# Patient Record
Sex: Male | Born: 1970 | Race: White | Hispanic: No | Marital: Single | Smoking: Former smoker
Health system: Southern US, Community
[De-identification: ages and names within clinical notes are randomized; demographics above are authoritative.]

## PROBLEM LIST (undated history)

## (undated) DIAGNOSIS — J45909 Unspecified asthma, uncomplicated: Secondary | ICD-10-CM

## (undated) DIAGNOSIS — E119 Type 2 diabetes mellitus without complications: Secondary | ICD-10-CM

## (undated) DIAGNOSIS — T7840XA Allergy, unspecified, initial encounter: Secondary | ICD-10-CM

## (undated) DIAGNOSIS — G4730 Sleep apnea, unspecified: Secondary | ICD-10-CM

## (undated) DIAGNOSIS — I10 Essential (primary) hypertension: Secondary | ICD-10-CM

## (undated) DIAGNOSIS — E785 Hyperlipidemia, unspecified: Secondary | ICD-10-CM

## (undated) HISTORY — DX: Essential (primary) hypertension: I10

## (undated) HISTORY — DX: Unspecified asthma, uncomplicated: J45.909

## (undated) HISTORY — DX: Type 2 diabetes mellitus without complications: E11.9

## (undated) HISTORY — DX: Allergy, unspecified, initial encounter: T78.40XA

## (undated) HISTORY — DX: Hyperlipidemia, unspecified: E78.5

## (undated) HISTORY — DX: Sleep apnea, unspecified: G47.30

---
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
## 1997-12-19 ENCOUNTER — Emergency Department (HOSPITAL_COMMUNITY): Admission: EM | Admit: 1997-12-19 | Discharge: 1997-12-19 | Payer: Self-pay | Admitting: Emergency Medicine

## 1999-11-24 ENCOUNTER — Encounter: Admission: RE | Admit: 1999-11-24 | Discharge: 1999-11-24 | Payer: Self-pay | Admitting: Family Medicine

## 1999-11-24 ENCOUNTER — Encounter: Payer: Self-pay | Admitting: Family Medicine

## 2002-05-31 HISTORY — PX: GANGLION CYST EXCISION: SHX1691

## 2005-01-05 ENCOUNTER — Ambulatory Visit: Payer: Self-pay | Admitting: Family Medicine

## 2005-02-22 ENCOUNTER — Ambulatory Visit: Payer: Self-pay | Admitting: Family Medicine

## 2005-03-01 ENCOUNTER — Ambulatory Visit: Payer: Self-pay | Admitting: Family Medicine

## 2005-03-15 ENCOUNTER — Ambulatory Visit: Payer: Self-pay | Admitting: Family Medicine

## 2005-04-16 ENCOUNTER — Ambulatory Visit: Payer: Self-pay | Admitting: Family Medicine

## 2005-10-06 ENCOUNTER — Ambulatory Visit: Payer: Self-pay | Admitting: Family Medicine

## 2005-10-08 ENCOUNTER — Ambulatory Visit: Payer: Self-pay | Admitting: Family Medicine

## 2005-10-14 ENCOUNTER — Ambulatory Visit: Payer: Self-pay | Admitting: Family Medicine

## 2006-08-02 ENCOUNTER — Ambulatory Visit: Payer: Self-pay | Admitting: Family Medicine

## 2006-08-02 LAB — CONVERTED CEMR LAB
ALT: 41 units/L — ABNORMAL HIGH (ref 0–40)
AST: 33 units/L (ref 0–37)
Albumin: 4.1 g/dL (ref 3.5–5.2)
Alkaline Phosphatase: 71 units/L (ref 39–117)
BUN: 13 mg/dL (ref 6–23)
Basophils Absolute: 0 10*3/uL (ref 0.0–0.1)
Basophils Relative: 0.8 % (ref 0.0–1.0)
Bilirubin, Direct: 0.1 mg/dL (ref 0.0–0.3)
CO2: 29 meq/L (ref 19–32)
Calcium: 9.3 mg/dL (ref 8.4–10.5)
Chloride: 106 meq/L (ref 96–112)
Cholesterol: 173 mg/dL (ref 0–200)
Creatinine, Ser: 1 mg/dL (ref 0.4–1.5)
Direct LDL: 46.6 mg/dL
Eosinophils Absolute: 0.1 10*3/uL (ref 0.0–0.6)
Eosinophils Relative: 1.8 % (ref 0.0–5.0)
GFR calc Af Amer: 110 mL/min
GFR calc non Af Amer: 91 mL/min
Glucose, Bld: 95 mg/dL (ref 70–99)
HCT: 42.7 % (ref 39.0–52.0)
HDL: 49.7 mg/dL (ref >39.0)
Hemoglobin: 14.6 g/dL (ref 13.0–17.0)
Lymphocytes Relative: 37.8 % (ref 12.0–46.0)
MCHC: 34.3 g/dL (ref 30.0–36.0)
MCV: 91 fL (ref 78.0–100.0)
Monocytes Absolute: 0.5 10*3/uL (ref 0.2–0.7)
Monocytes Relative: 13.3 % — ABNORMAL HIGH (ref 3.0–11.0)
Neutro Abs: 2 10*3/uL (ref 1.4–7.7)
Neutrophils Relative %: 46.3 % (ref 43.0–77.0)
Platelets: 230 10*3/uL (ref 150–400)
Potassium: 4.4 meq/L (ref 3.5–5.1)
RBC: 4.7 M/uL (ref 4.22–5.81)
RDW: 12.4 % (ref 11.5–14.6)
Sodium: 142 meq/L (ref 135–145)
TSH: 3.05 microintl units/mL (ref 0.35–5.50)
Total Bilirubin: 0.9 mg/dL (ref 0.3–1.2)
Total CHOL/HDL Ratio: 3.5
Total Protein: 7.1 g/dL (ref 6.0–8.3)
Triglycerides: 317 mg/dL (ref 0–149)
VLDL: 63 mg/dL — ABNORMAL HIGH (ref 0–40)
WBC: 4.1 10*3/uL — ABNORMAL LOW (ref 4.5–10.5)

## 2006-08-09 ENCOUNTER — Ambulatory Visit: Payer: Self-pay | Admitting: Family Medicine

## 2007-12-07 ENCOUNTER — Telehealth: Payer: Self-pay | Admitting: *Deleted

## 2008-03-21 ENCOUNTER — Telehealth: Payer: Self-pay | Admitting: Family Medicine

## 2008-03-22 ENCOUNTER — Telehealth: Payer: Self-pay | Admitting: Family Medicine

## 2008-04-20 DIAGNOSIS — J069 Acute upper respiratory infection, unspecified: Secondary | ICD-10-CM | POA: Insufficient documentation

## 2008-04-22 ENCOUNTER — Ambulatory Visit: Payer: Self-pay | Admitting: Family Medicine

## 2008-04-22 DIAGNOSIS — E785 Hyperlipidemia, unspecified: Secondary | ICD-10-CM | POA: Insufficient documentation

## 2008-04-22 DIAGNOSIS — J309 Allergic rhinitis, unspecified: Secondary | ICD-10-CM | POA: Insufficient documentation

## 2008-04-22 DIAGNOSIS — Z87442 Personal history of urinary calculi: Secondary | ICD-10-CM | POA: Insufficient documentation

## 2008-04-22 LAB — CONVERTED CEMR LAB
ALT: 48 units/L (ref 0–53)
AST: 31 units/L (ref 0–37)
Albumin: 4.1 g/dL (ref 3.5–5.2)
Alkaline Phosphatase: 77 units/L (ref 39–117)
BUN: 8 mg/dL (ref 6–23)
Basophils Absolute: 0 10*3/uL (ref 0.0–0.1)
Basophils Relative: 0.6 % (ref 0.0–3.0)
Bilirubin Urine: NEGATIVE
Bilirubin, Direct: 0.1 mg/dL (ref 0.0–0.3)
Blood in Urine, dipstick: NEGATIVE
CO2: 28 meq/L (ref 19–32)
Calcium: 9.6 mg/dL (ref 8.4–10.5)
Chloride: 106 meq/L (ref 96–112)
Cholesterol: 120 mg/dL (ref 0–200)
Creatinine, Ser: 1 mg/dL (ref 0.4–1.5)
Eosinophils Absolute: 0.1 10*3/uL (ref 0.0–0.7)
Eosinophils Relative: 1 % (ref 0.0–5.0)
GFR calc Af Amer: 109 mL/min
GFR calc non Af Amer: 90 mL/min
Glucose, Bld: 109 mg/dL — ABNORMAL HIGH (ref 70–99)
Glucose, Urine, Semiquant: NEGATIVE
HCT: 41.9 % (ref 39.0–52.0)
HDL: 51.6 mg/dL (ref >39.0)
Hemoglobin: 14.6 g/dL (ref 13.0–17.0)
Ketones, urine, test strip: NEGATIVE
LDL Cholesterol: 41 mg/dL (ref 0–99)
Lymphocytes Relative: 20.8 % (ref 12.0–46.0)
MCHC: 34.9 g/dL (ref 30.0–36.0)
MCV: 89.4 fL (ref 78.0–100.0)
Monocytes Absolute: 1 10*3/uL (ref 0.1–1.0)
Monocytes Relative: 15.4 % — ABNORMAL HIGH (ref 3.0–12.0)
Neutro Abs: 4 10*3/uL (ref 1.4–7.7)
Neutrophils Relative %: 62.2 % (ref 43.0–77.0)
Nitrite: NEGATIVE
Platelets: 216 10*3/uL (ref 150–400)
Potassium: 4.3 meq/L (ref 3.5–5.1)
Protein, U semiquant: NEGATIVE
RBC: 4.68 M/uL (ref 4.22–5.81)
RDW: 12.4 % (ref 11.5–14.6)
Sodium: 141 meq/L (ref 135–145)
Specific Gravity, Urine: 1.025
TSH: 2.94 microintl units/mL (ref 0.35–5.50)
Total Bilirubin: 0.8 mg/dL (ref 0.3–1.2)
Total CHOL/HDL Ratio: 2.3
Total Protein: 8.1 g/dL (ref 6.0–8.3)
Triglycerides: 135 mg/dL (ref 0–149)
Urobilinogen, UA: 0.2
VLDL: 27 mg/dL (ref 0–40)
WBC Urine, dipstick: NEGATIVE
WBC: 6.4 10*3/uL (ref 4.5–10.5)
pH: 5

## 2009-02-18 ENCOUNTER — Ambulatory Visit: Payer: Self-pay | Admitting: Family Medicine

## 2009-02-18 DIAGNOSIS — J45909 Unspecified asthma, uncomplicated: Secondary | ICD-10-CM | POA: Insufficient documentation

## 2009-03-11 ENCOUNTER — Ambulatory Visit: Payer: Self-pay | Admitting: Family Medicine

## 2009-03-11 DIAGNOSIS — I10 Essential (primary) hypertension: Secondary | ICD-10-CM | POA: Insufficient documentation

## 2009-03-20 ENCOUNTER — Ambulatory Visit: Payer: Self-pay | Admitting: Family Medicine

## 2009-03-24 LAB — CONVERTED CEMR LAB
BUN: 12 mg/dL (ref 6–23)
CO2: 29 meq/L (ref 19–32)
Calcium: 9.7 mg/dL (ref 8.4–10.5)
Chloride: 104 meq/L (ref 96–112)
Creatinine, Ser: 0.9 mg/dL (ref 0.4–1.5)
GFR calc non Af Amer: 100.71 mL/min (ref >60)
Glucose, Bld: 96 mg/dL (ref 70–99)
Potassium: 4.4 meq/L (ref 3.5–5.1)
Sodium: 140 meq/L (ref 135–145)

## 2009-05-22 ENCOUNTER — Ambulatory Visit: Payer: Self-pay | Admitting: Family Medicine

## 2009-06-04 ENCOUNTER — Telehealth: Payer: Self-pay | Admitting: Family Medicine

## 2010-06-08 ENCOUNTER — Ambulatory Visit
Admission: RE | Admit: 2010-06-08 | Discharge: 2010-06-08 | Payer: Self-pay | Source: Home / Self Care | Attending: Family Medicine | Admitting: Family Medicine

## 2010-06-08 ENCOUNTER — Other Ambulatory Visit: Payer: Self-pay | Admitting: Family Medicine

## 2010-06-08 ENCOUNTER — Encounter: Payer: Self-pay | Admitting: Family Medicine

## 2010-06-08 LAB — CBC WITH DIFFERENTIAL/PLATELET
Basophils Absolute: 0 10*3/uL (ref 0.0–0.1)
Basophils Relative: 0.4 % (ref 0.0–3.0)
Eosinophils Absolute: 0 10*3/uL (ref 0.0–0.7)
Eosinophils Relative: 0.8 % (ref 0.0–5.0)
HCT: 43.5 % (ref 39.0–52.0)
Hemoglobin: 14.8 g/dL (ref 13.0–17.0)
Lymphocytes Relative: 23.2 % (ref 12.0–46.0)
Lymphs Abs: 1 10*3/uL (ref 0.7–4.0)
MCHC: 34.1 g/dL (ref 30.0–36.0)
MCV: 93.3 fl (ref 78.0–100.0)
Monocytes Absolute: 0.6 10*3/uL (ref 0.1–1.0)
Monocytes Relative: 14.5 % — ABNORMAL HIGH (ref 3.0–12.0)
Neutro Abs: 2.5 10*3/uL (ref 1.4–7.7)
Neutrophils Relative %: 61.1 % (ref 43.0–77.0)
Platelets: 192 10*3/uL (ref 150.0–400.0)
RBC: 4.66 Mil/uL (ref 4.22–5.81)
RDW: 14.2 % (ref 11.5–14.6)
WBC: 4.1 10*3/uL — ABNORMAL LOW (ref 4.5–10.5)

## 2010-06-08 LAB — BASIC METABOLIC PANEL
BUN: 15 mg/dL (ref 6–23)
CO2: 26 mEq/L (ref 19–32)
Calcium: 10.3 mg/dL (ref 8.4–10.5)
Chloride: 101 mEq/L (ref 96–112)
Creatinine, Ser: 0.9 mg/dL (ref 0.4–1.5)
GFR: 96.34 mL/min (ref >60.00)
Glucose, Bld: 91 mg/dL (ref 70–99)
Potassium: 4.8 mEq/L (ref 3.5–5.1)
Sodium: 138 mEq/L (ref 135–145)

## 2010-06-08 LAB — CONVERTED CEMR LAB
Blood in Urine, dipstick: NEGATIVE
Glucose, Urine, Semiquant: NEGATIVE
Nitrite: NEGATIVE
Protein, U semiquant: NEGATIVE
Specific Gravity, Urine: 1.02
Urobilinogen, UA: 0.2
WBC Urine, dipstick: NEGATIVE
pH: 5

## 2010-06-08 LAB — HEPATIC FUNCTION PANEL
ALT: 51 U/L (ref 0–53)
AST: 37 U/L (ref 0–37)
Albumin: 4.7 g/dL (ref 3.5–5.2)
Alkaline Phosphatase: 60 U/L (ref 39–117)
Bilirubin, Direct: 0.1 mg/dL (ref 0.0–0.3)
Total Bilirubin: 0.9 mg/dL (ref 0.3–1.2)
Total Protein: 7.9 g/dL (ref 6.0–8.3)

## 2010-06-08 LAB — LIPID PANEL
Cholesterol: 230 mg/dL — ABNORMAL HIGH (ref 0–200)
HDL: 53.1 mg/dL (ref >39.00)
Total CHOL/HDL Ratio: 4
Triglycerides: 275 mg/dL — ABNORMAL HIGH (ref 0.0–149.0)
VLDL: 55 mg/dL — ABNORMAL HIGH (ref 0.0–40.0)

## 2010-06-08 LAB — LDL CHOLESTEROL, DIRECT: Direct LDL: 82.3 mg/dL

## 2010-06-08 LAB — TSH: TSH: 2.48 u[IU]/mL (ref 0.35–5.50)

## 2010-06-28 LAB — CONVERTED CEMR LAB
ALT: 51 units/L (ref 0–53)
AST: 36 units/L (ref 0–37)
Albumin: 4.5 g/dL (ref 3.5–5.2)
Alkaline Phosphatase: 69 units/L (ref 39–117)
BUN: 10 mg/dL (ref 6–23)
Basophils Absolute: 0 10*3/uL (ref 0.0–0.1)
Basophils Relative: 0.2 % (ref 0.0–3.0)
Bilirubin Urine: NEGATIVE
Bilirubin, Direct: 0 mg/dL (ref 0.0–0.3)
Blood in Urine, dipstick: NEGATIVE
CO2: 26 meq/L (ref 19–32)
Calcium: 9.6 mg/dL (ref 8.4–10.5)
Chloride: 103 meq/L (ref 96–112)
Cholesterol: 179 mg/dL (ref 0–200)
Creatinine, Ser: 0.9 mg/dL (ref 0.4–1.5)
Direct LDL: 54.6 mg/dL
Eosinophils Absolute: 0 10*3/uL (ref 0.0–0.7)
Eosinophils Relative: 0.7 % (ref 0.0–5.0)
GFR calc non Af Amer: 100.62 mL/min (ref >60)
Glucose, Bld: 103 mg/dL — ABNORMAL HIGH (ref 70–99)
Glucose, Urine, Semiquant: NEGATIVE
HCT: 42.9 % (ref 39.0–52.0)
HDL: 56.2 mg/dL (ref >39.00)
Hemoglobin: 14.3 g/dL (ref 13.0–17.0)
Ketones, urine, test strip: NEGATIVE
Lymphocytes Relative: 18.3 % (ref 12.0–46.0)
Lymphs Abs: 1.3 10*3/uL (ref 0.7–4.0)
MCHC: 33.3 g/dL (ref 30.0–36.0)
MCV: 93.6 fL (ref 78.0–100.0)
Monocytes Absolute: 0.9 10*3/uL (ref 0.1–1.0)
Monocytes Relative: 13 % — ABNORMAL HIGH (ref 3.0–12.0)
Neutro Abs: 4.7 10*3/uL (ref 1.4–7.7)
Neutrophils Relative %: 67.8 % (ref 43.0–77.0)
Nitrite: NEGATIVE
Platelets: 199 10*3/uL (ref 150.0–400.0)
Potassium: 4.4 meq/L (ref 3.5–5.1)
Protein, U semiquant: NEGATIVE
RBC: 4.58 M/uL (ref 4.22–5.81)
RDW: 13.4 % (ref 11.5–14.6)
Sodium: 137 meq/L (ref 135–145)
Specific Gravity, Urine: <1.005
TSH: 2.03 microintl units/mL (ref 0.35–5.50)
Total Bilirubin: 1 mg/dL (ref 0.3–1.2)
Total CHOL/HDL Ratio: 3
Total Protein: 8.1 g/dL (ref 6.0–8.3)
Triglycerides: 371 mg/dL — ABNORMAL HIGH (ref 0.0–149.0)
Urobilinogen, UA: 0.2
VLDL: 74.2 mg/dL — ABNORMAL HIGH (ref 0.0–40.0)
WBC Urine, dipstick: NEGATIVE
WBC: 6.9 10*3/uL (ref 4.5–10.5)
pH: 5.5

## 2010-06-30 NOTE — Assessment & Plan Note (Signed)
Summary: 3 wk rov/mm   Vital Signs:  Patient profile:   40 year old male Weight:      239 pounds Temp:     98.5 degrees F oral BP sitting:   142 / 82  Vitals Entered By: Lynann Beaver CMA (March 11, 2009 10:00 AM) CC: rov Is Patient Diabetic? No   CC:  rov.  History of Present Illness: Edwin Nelson is a 40 year old male, who comes back today for evaluation of elevated blood pressure.  He has underlying hyperlipidemia, which he inherited from his father.  He is on Crestor 20 mg daily.  Also, recently his blood pressure became elevated.  He's been monitoring his pressure at home and are all elevated.  The, highest it's been 165/90.  His mother has hypertension and is on atenolol.  He also takes Qvar 1 cup b.i.d. for asthma with Ventolin p.r.n. for exercise-induced asthma.  Current Medications (verified): 1)  Crestor 20 Mg Tabs (Rosuvastatin Calcium) .... Take One Tab Once Daily 2)  Flonase 50 Mcg/act Susp (Fluticasone Propionate) .... One Shot R&l Nose Qhs 3)  Qvar 40 Mcg/act Aers (Beclomethasone Dipropionate) .Marland Kitchen.. 1 Puff Two Times A Day 4)  Ventolin Hfa 108 (90 Base) Mcg/act Aers (Albuterol Sulfate) .... 2 Puffs 30 Min Prior To Exercise  Allergies (verified): No Known Drug Allergies  Past History:  Past Medical History: Hyperlipidemia Allergic rhinitis Nephrolithiasis, hx of Hypertension  Social History: Reviewed history from 04/22/2008 and no changes required. Occupation: Married Former Smoker Alcohol use-no Drug use-no Regular exercise-no  Review of Systems      See HPI  Physical Exam  General:  Well-developed,well-nourished,in no acute distress; alert,appropriate and cooperative throughout examination Heart:  142/82   Impression & Recommendations:  Problem # 1:  HYPERTENSION (ICD-401.9) Assessment New  Orders: Prescription Created Electronically [phone])  His updated medication list for this problem includes:    Zestril 10 Mg Tabs (Lisinopril) .Marland Kitchen... Take  1 tablet by mouth every morning  Complete Medication List: 1)  Crestor 20 Mg Tabs (Rosuvastatin calcium) .... Take one tab once daily 2)  Flonase 50 Mcg/act Susp (Fluticasone propionate) .... One shot r&l nose qhs 3)  Qvar 40 Mcg/act Aers (Beclomethasone dipropionate) .Marland Kitchen.. 1 puff two times a day 4)  Ventolin Hfa 108 (90 Base) Mcg/act Aers (Albuterol sulfate) .... 2 puffs 30 min prior to exercise 5)  Zestril 10 Mg Tabs (Lisinopril) .... Take 1 tablet by mouth every morning 6)  Proventil Hfa 108 (90 Base) Mcg/act Aers (Albuterol sulfate)  Patient Instructions: 1)  begin lisinopril 10 mg q.a.m. to check a morning blood pressure daily.  Return in one week for follow-up while taking this class of medication avoid all NSAIDs Prescriptions: VENTOLIN HFA 108 (90 BASE) MCG/ACT AERS (ALBUTEROL SULFATE) 2 puffs 30 min prior to exercise  #1 x 2   Entered and Authorized by:   Roderick Pee MD   Signed by:   Roderick Pee MD on 03/11/2009   Method used:   Print then Give to Patient   RxID:   [id number] ZESTRIL 10 MG TABS (LISINOPRIL) Take 1 tablet by mouth every morning  #100 x 3   Entered and Authorized by:   Roderick Pee MD   Signed by:   Roderick Pee MD on 03/11/2009   Method used:   Print then Give to Patient   RxID:   [id number]

## 2010-06-30 NOTE — Progress Notes (Signed)
Summary: crestor refill  Phone Note From Pharmacy   Caller: [facility] Details for Reason: crestor refill Summary of Call: pt would like a refill of his crestor  Follow-up for Phone Call        Pharmacist called Follow-up by: Kern Reap CMA,  December 07, 2007 4:43 PM      Prescriptions: CRESTOR 20 MG  TABS (ROSUVASTATIN CALCIUM) at bedtime  #30 x 0   Entered and Authorized by:   Kern Reap CMA   Signed by:   Kern Reap CMA on 12/07/2007   Method used:   Print then Give to Patient   RxID:   [id number]

## 2010-06-30 NOTE — Assessment & Plan Note (Signed)
Summary: cpx/[pt will be fasting/mm   Vital Signs:  Patient profile:   40 year old male Height:      68 inches Weight:      238 pounds Temp:     98.7 degrees F oral BP sitting:   120 / 80  (left arm) Cuff size:   regular  Vitals Entered By: Kern Reap CMA Duncan Dull) (May 22, 2009 11:01 AM)  Reason for Visit cpx  History of Present Illness: [name] is a 40 year old, married male, nonsmoker, who comes in today for evaluation of hypertension, hyperlipidemia, allergic rhinitis and asthma.  His hypertension is treated with Zestril 10 mg daily.  BP 120/80.  No side effects from medication.  His hyperlipidemia is treated with Crestor 20 mg nightly would check lipid panel today.  We tried other generic products like simvastatin Primacor, but either they don't work or they gave him side effects.  Therefore, he must stay on the Crestor.  He takes Qvar 41 puff b.i.d. for asthma.  He is having some flares would like to discuss increasing the dose.  Uses Proventil only p.r.n. for rescue.  He also uses over-the-counter Claritin and Flonase nasal spray for allergic rhinitis.  He also takes an 81-mg baby aspirin daily.  Tetanus booster 2004, seasonal flu 2010  Allergies (verified): No Known Drug Allergies  Past History:  Past medical, surgical, family and social histories (including risk factors) reviewed, and no changes noted (except as noted below).  Past Medical History: Reviewed history from 03/11/2009 and no changes required. Hyperlipidemia Allergic rhinitis Nephrolithiasis, hx of Hypertension  Past Surgical History: Reviewed history from 04/22/2008 and no changes required. Tonsillectomy  Family History: Reviewed history from 04/22/2008 and no changes required. father had an MI at age 49 with underlying obesity, diabetes, hyperlipidemia, and smoking.  Mother in good health except for high blood pressure.  A younger brother, Jonny Ruiz has hyperlipidemia  Social  History: Reviewed history from 04/22/2008 and no changes required. Occupation: Married Former Smoker Alcohol use-no Drug use-no Regular exercise-no  Review of Systems      See HPI  Physical Exam  General:  Well-developed,well-nourished,in no acute distress; alert,appropriate and cooperative throughout examination Head:  Normocephalic and atraumatic without obvious abnormalities. No apparent alopecia or balding. Eyes:  No corneal or conjunctival inflammation noted. EOMI. Perrla. Funduscopic exam benign, without hemorrhages, exudates or papilledema. Vision grossly normal. Ears:  External ear exam shows no significant lesions or deformities.  Otoscopic examination reveals clear canals, tympanic membranes are intact bilaterally without bulging, retraction, inflammation or discharge. Hearing is grossly normal bilaterally. Nose:  External nasal examination shows no deformity or inflammation. Nasal mucosa are pink and moist without lesions or exudates. Mouth:  Oral mucosa and oropharynx without lesions or exudates.  Teeth in good repair. Neck:  No deformities, masses, or tenderness noted. Chest Wall:  No deformities, masses, tenderness or gynecomastia noted. Breasts:  No masses or gynecomastia noted Lungs:  Normal respiratory effort, chest expands symmetrically. Lungs are clear to auscultation, no crackles or wheezes. Heart:  Normal rate and regular rhythm. S1 and S2 normal without gallop, murmur, click, rub or other extra sounds. Abdomen:  Bowel sounds positive,abdomen soft and non-tender without masses, organomegaly or hernias noted. Rectal:  No external abnormalities noted. Normal sphincter tone. No rectal masses or tenderness. Genitalia:  Testes bilaterally descended without nodularity, tenderness or masses. No scrotal masses or lesions. No penis lesions or urethral discharge. Prostate:  Prostate gland firm and smooth, no enlargement, nodularity, tenderness, mass, asymmetry or  induration. Msk:  No deformity or scoliosis noted of thoracic or lumbar spine.   Pulses:  R and L carotid,radial,femoral,dorsalis pedis and posterior tibial pulses are full and equal bilaterally Extremities:  No clubbing, cyanosis, edema, or deformity noted with normal full range of motion of all joints.   Neurologic:  No cranial nerve deficits noted. Station and gait are normal. Plantar reflexes are down-going bilaterally. DTRs are symmetrical throughout. Sensory, motor and coordinative functions appear intact. Skin:  Intact without suspicious lesions or rashes Cervical Nodes:  No lymphadenopathy noted Axillary Nodes:  No palpable lymphadenopathy Inguinal Nodes:  No significant adenopathy Psych:  Cognition and judgment appear intact. Alert and cooperative with normal attention span and concentration. No apparent delusions, illusions, hallucinations   Impression & Recommendations:  Problem # 1:  HYPERTENSION (ICD-401.9) Assessment Improved  His updated medication list for this problem includes:    Zestril 10 Mg Tabs (Lisinopril) .Marland Kitchen... Take 1 tablet by mouth every morning  Orders: Venipuncture (81191) Prescription Created Electronically [phone]) UA Dipstick w/o Micro (automated)  (81003) TLB-Lipid Panel (80061-LIPID) TLB-BMP (Basic Metabolic Panel-BMET) (80048-METABOL) TLB-CBC Platelet - w/Differential (85025-CBCD) TLB-Hepatic/Liver Function Pnl (80076-HEPATIC) TLB-TSH (Thyroid Stimulating Hormone) (84443-TSH)  Problem # 2:  EXTRINSIC ASTHMA, UNSPECIFIED (ICD-493.00) Assessment: Improved  The following medications were removed from the medication list:    Qvar 40 Mcg/act Aers (Beclomethasone dipropionate) .Marland Kitchen... 1 puff two times a day    Ventolin Hfa 108 (90 Base) Mcg/act Aers (Albuterol sulfate) .Marland Kitchen... 2 puffs 30 min prior to exercise His updated medication list for this problem includes:    Proventil Hfa 108 (90 Base) Mcg/act Aers (Albuterol sulfate) .Marland Kitchen... 2 puff three times a day  as needed    Qvar 80 Mcg/act Aers (Beclomethasone dipropionate) .Marland Kitchen... 1 puff two times a day  Orders: Venipuncture (56213) Prescription Created Electronically [phone]) UA Dipstick w/o Micro (automated)  (81003) TLB-Lipid Panel (80061-LIPID) TLB-BMP (Basic Metabolic Panel-BMET) (80048-METABOL) TLB-CBC Platelet - w/Differential (85025-CBCD) TLB-Hepatic/Liver Function Pnl (80076-HEPATIC) TLB-TSH (Thyroid Stimulating Hormone) (84443-TSH)  Problem # 3:  ALLERGIC RHINITIS (ICD-477.9) Assessment: Improved  His updated medication list for this problem includes:    Flonase 50 Mcg/act Susp (Fluticasone propionate) ..... One shot r&l nose qhs    Claritin 10 Mg Tabs (Loratadine) .Marland Kitchen... Take one tab once daily  Orders: Venipuncture (84696) Prescription Created Electronically [phone] UA Dipstick w/o Micro (automated)  (81003) TLB-Lipid Panel (80061-LIPID) TLB-BMP (Basic Metabolic Panel-BMET) (80048-METABOL) TLB-CBC Platelet - w/Differential (85025-CBCD) TLB-Hepatic/Liver Function Pnl (80076-HEPATIC) TLB-TSH (Thyroid Stimulating Hormone) (84443-TSH)  Problem # 4:  HYPERLIPIDEMIA (ICD-272.4) Assessment: Improved  His updated medication list for this problem includes:    Crestor 20 Mg Tabs (Rosuvastatin calcium) .Marland Kitchen... Take one tab once daily  Orders: Venipuncture (41324) Prescription Created Electronically [phone]) UA Dipstick w/o Micro (automated)  (81003) EKG w/ Interpretation (93000) TLB-Lipid Panel (80061-LIPID) TLB-BMP (Basic Metabolic Panel-BMET) (80048-METABOL) TLB-CBC Platelet - w/Differential (85025-CBCD) TLB-Hepatic/Liver Function Pnl (80076-HEPATIC) TLB-TSH (Thyroid Stimulating Hormone) (84443-TSH)  Complete Medication List: 1)  Crestor 20 Mg Tabs (Rosuvastatin calcium) .... Take one tab once daily 2)  Flonase 50 Mcg/act Susp (Fluticasone propionate) .... One shot r&l nose qhs 3)  Zestril 10 Mg Tabs (Lisinopril) .... Take 1 tablet by mouth every morning 4)  Proventil Hfa 108  (90 Base) Mcg/act Aers (Albuterol sulfate) .... 2 puff three times a day as needed 5)  Aspirin 81 Mg Tbec (Aspirin) .... Once daily 6)  Daily Vitamins Tabs (Multiple vitamin) .... Once daily 7)  Claritin 10 Mg Tabs (Loratadine) .... Take one tab  once daily 8)  Qvar 80 Mcg/act Aers (Beclomethasone dipropionate) .Marland Kitchen.. 1 puff two times a day  Patient Instructions: 1)  Please schedule a follow-up appointment in 1 year. 2)  It is important that you exercise regularly at least 20 minutes 5 times a week. If you develop chest pain, have severe difficulty breathing, or feel very tired , stop exercising immediately and seek medical attention. 3)  You need to lose weight. Consider a lower calorie diet and regular exercise.  Prescriptions: PROVENTIL HFA 108 (90 BASE) MCG/ACT AERS (ALBUTEROL SULFATE) 2 puff three times a day as needed  #1 x 1   Entered and Authorized by:   Roderick Pee MD   Signed by:   Roderick Pee MD on 05/22/2009   Method used:   Electronically to        [facility]* (mail-order)             ,          Ph: [phone]       Fax: [phone]   RxID:   [id number] ZESTRIL 10 MG TABS (LISINOPRIL) Take 1 tablet by mouth every morning  #100 x 3   Entered and Authorized by:   Roderick Pee MD   Signed by:   Roderick Pee MD on 05/22/2009   Method used:   Electronically to        [facility]* (mail-order)             ,          Ph: [phone]       Fax: [phone]   RxID:   [id number] FLONASE 50 MCG/ACT SUSP (FLUTICASONE PROPIONATE) one shot R&L nose qhs  #3 units x 3   Entered and Authorized by:   Roderick Pee MD   Signed by:   Roderick Pee MD on 05/22/2009   Method used:   Electronically to        [facility]* (mail-order)             ,          Ph: [phone]       Fax: [phone]   RxID:   [id number] CRESTOR 20 MG TABS (ROSUVASTATIN CALCIUM) take one tab once daily  #100 x 3   Entered and Authorized by:   Roderick Pee MD   Signed  by:   Roderick Pee MD on 05/22/2009   Method used:   Electronically to        [facility]* (mail-order)             ,          Ph: [phone]       Fax: [phone]   RxID:   [id number] HBZJ 80 MCG/ACT AERS (BECLOMETHASONE DIPROPIONATE) 1 puff two times a day  #3 units x 3   Entered and Authorized by:   Roderick Pee MD   Signed by:   Roderick Pee MD on 05/22/2009   Method used:   Electronically to        [facility]* (mail-order)             ,          Ph: [phone]       Fax: [phone]   RxID:   [id number]    Immunization History:  Influenza Immunization History:    Influenza:  historical (02/28/2009)   Laboratory Results   Urine Tests  Routine Urinalysis   Color: yellow Appearance: Clear Glucose: negative   (Normal Range: Negative) Bilirubin: negative   (Normal Range: Negative) Ketone: negative   (Normal Range: Negative) Spec. Gravity: <1.005   (Normal Range: 1.003-1.035) Blood: negative   (Normal Range: Negative) pH: 5.5   (Normal Range: 5.0-8.0) Protein: negative   (Normal Range: Negative) Urobilinogen: 0.2   (Normal Range: 0-1) Nitrite: negative   (Normal Range: Negative) Leukocyte Esterace: negative   (Normal Range: Negative)    Comments: Joanne Chars CMA  May 22, 2009 3:07 PM

## 2010-06-30 NOTE — Progress Notes (Signed)
Summary: crestor denied  Phone Note From Pharmacy   Details for Reason: crestor refill Summary of Call: pharm request refill of crestor Initial call taken by: Kern Reap CMA,  March 21, 2008 2:03 PM  Follow-up for Phone Call        rx denied Follow-up by: Kern Reap CMA,  March 21, 2008 2:03 PM      Prescriptions: CRESTOR 20 MG  TABS (ROSUVASTATIN CALCIUM) at bedtime  #0 x 0   Entered and Authorized by:   Kern Reap CMA   Signed by:   Kern Reap CMA on 03/21/2008   Method used:   Print then Give to Patient   RxID:   [id number]

## 2010-06-30 NOTE — Assessment & Plan Note (Signed)
Summary: med check/mhf   Vital Signs:  Patient Profile:   40 Years Old Male Height:     68 inches Weight:      233 pounds Temp:     98.3 degrees F oral Pulse rate:   64 / minute Pulse rhythm:   regular BP sitting:   120 / 80  (left arm) Cuff size:   large  Vitals Entered By: Kern Reap CMA (April 22, 2008 9:27 AM)                 Chief Complaint:  follow up meds and leg.  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  His underlying hyperlipidemia is on Crestor 20 mg daily.  Check fasting lipid panel today.  His underlying allergic rhinitis is try Claritin 10 mg daily, but is not helping.  We discussed options, including taking Zyrtec, 10 mg two times a day, that time, and having a steroid nasal spray, and if all else fails, taking a short course of oral prednisone.  Saturday he developed a viral-like syndrome with sore throat, aching, fever, chills, cough, nonproductive.  Today, temperature is 98.3.  A year ago.  We started him on a smoking cessation program.  He quit smoking and has been off cigarettes for 4 year.  Last tetanus was two 2004 seasonal flu shot.  He had in October.    Current Allergies: No known allergies   Past Medical History:    Reviewed history and no changes required:       Hyperlipidemia       Allergic rhinitis       Nephrolithiasis, hx of  Past Surgical History:    Reviewed history and no changes required:       Tonsillectomy   Family History:    Reviewed history and no changes required:       father had an MI at age 23 with underlying obesity, diabetes, hyperlipidemia, and smoking.              Mother in good health except for high blood pressure.              A younger brother, Edwin Nelson has hyperlipidemia  Social  History:    Reviewed history and no changes required:       Occupation:       Married       Former Smoker       Alcohol use-no       Drug use-no       Regular exercise-no   Risk Factors:  Tobacco use:  quit    Year quit:  08 Drug use:  no Alcohol use:  no Exercise:  no   Review of Systems      See HPI   Physical Exam  General:     Well-developed,well-nourished,in no acute distress; alert,appropriate and cooperative throughout examination Head:     Normocephalic and atraumatic without obvious abnormalities. No apparent alopecia or balding. Eyes:     No corneal or conjunctival inflammation noted. EOMI. Perrla. Funduscopic exam benign, without hemorrhages, exudates or papilledema. Vision grossly normal. Ears:     External ear exam shows no significant lesions or deformities.  Otoscopic examination reveals clear canals, tympanic membranes are intact bilaterally without bulging, retraction, inflammation or discharge. Hearing is grossly normal  bilaterally. Nose:     External nasal examination shows no deformity or inflammation. Nasal mucosa are pink and moist without lesions or exudates. Mouth:     Oral mucosa and oropharynx without lesions or exudates.  Teeth in good repair. Neck:     No deformities, masses, or tenderness noted. Chest Wall:     No deformities, masses, tenderness or gynecomastia noted. Breasts:     No masses or gynecomastia noted Lungs:     Normal respiratory effort, chest expands symmetrically. Lungs are clear to auscultation, no crackles or wheezes. Heart:     Normal rate and regular rhythm. S1 and S2 normal without gallop, murmur, click, rub or other extra sounds. Abdomen:     Bowel sounds positive,abdomen soft and non-tender without masses, organomegaly or hernias noted. Rectal:     No external abnormalities noted. Normal sphincter tone. No rectal masses or tenderness. Genitalia:     Testes bilaterally descended without nodularity, tenderness or  masses. No scrotal masses or lesions. No penis lesions or urethral discharge. Prostate:     Prostate gland firm and smooth, no enlargement, nodularity, tenderness, mass, asymmetry or induration. Msk:     No deformity or scoliosis noted of thoracic or lumbar spine.   Pulses:     R and L carotid,radial,femoral,dorsalis pedis and posterior tibial pulses are full and equal bilaterally Extremities:     No clubbing, cyanosis, edema, or deformity noted with normal full range of motion of all joints.   Neurologic:     No cranial nerve deficits noted. Station and gait are normal. Plantar reflexes are down-going bilaterally. DTRs are symmetrical throughout. Sensory, motor and coordinative functions appear intact. Skin:     Intact without suspicious lesions or rashes Cervical Nodes:     No lymphadenopathy noted Axillary Nodes:     No palpable lymphadenopathy Inguinal Nodes:     No significant adenopathy Psych:     Cognition and judgment appear intact. Alert and cooperative with normal attention span and concentration. No apparent delusions, illusions, hallucinations    Impression & Recommendations:  Problem # 1:  HYPERLIPIDEMIA (ICD-272.4) Assessment: Improved  The following medications were removed from the medication list:    Crestor 20 Mg Tabs (Rosuvastatin calcium) .Marland Kitchen... At bedtime  His updated medication list for this problem includes:    Crestor 20 Mg Tabs (Rosuvastatin calcium) .Marland Kitchen... Take one tab once daily  Orders: Venipuncture (04540) TLB-Lipid Panel (80061-LIPID) TLB-BMP (Basic Metabolic Panel-BMET) (80048-METABOL) TLB-CBC Platelet - w/Differential (85025-CBCD) TLB-Hepatic/Liver Function Pnl (80076-HEPATIC) TLB-TSH (Thyroid Stimulating Hormone) (84443-TSH) EKG w/ Interpretation (93000) UA Dipstick w/o Micro (automated)  (81003)   Problem # 2:  ALLERGIC RHINITIS (ICD-477.9) Assessment: Deteriorated  Orders: Venipuncture (98119) TLB-Lipid Panel (80061-LIPID) TLB-BMP  (Basic Metabolic Panel-BMET) (80048-METABOL) TLB-CBC Platelet - w/Differential (85025-CBCD) TLB-Hepatic/Liver Function Pnl (80076-HEPATIC) TLB-TSH (Thyroid Stimulating Hormone) (84443-TSH) UA Dipstick w/o Micro (automated)  (81003)  His updated medication list for this problem includes:    Flonase 50 Mcg/act Susp (Fluticasone propionate) ..... One shot r&l nose qhs   Problem # 3:  VIRAL URI (ICD-465.9) Assessment: New  His updated medication list for this problem includes:    Hydromet 5-1.5 Mg/30ml Syrp (Hydrocodone-homatropine) .Marland Kitchen... 1 or 2 tsp three times a day as needed  Orders: UA Dipstick w/o Micro (automated)  (81003)   Complete Medication List: 1)  Crestor 20 Mg Tabs (Rosuvastatin calcium) .... Take one tab once daily 2)  Flonase 50 Mcg/act Susp (Fluticasone propionate) .... One shot r&l nose qhs 3)  Prednisone 20  Mg Tabs (Prednisone) .... Uad 4)  Hydromet 5-1.5 Mg/59ml Syrp (Hydrocodone-homatropine) .Marland Kitchen.. 1 or 2 tsp three times a day as needed   Patient Instructions: 1)  try Zyrtec 10 mg a day at bedtime.  If that does not help your symptoms then at a steroid nasal spray, one shot of each nostril at bedtime.  If the combination of the steroid nasal spray and Zyrtec does not work take a short course of prednisone.  Take two tablets daily for 3 days, one for 3 days, a half a tablet for 3 days, then stop. 2)  for your viral infection.  Drink lots of liquids take aspirin and Tylenol for fever and chills.  Also, right using Hydromet one or 2 teaspoons at bedtime as needed for cough 3)  I will call you when I get the report on your lab work   Prescriptions: CRESTOR 20 MG TABS (ROSUVASTATIN CALCIUM) take one tab once daily  #100 x 3   Entered and Authorized by:   Roderick Pee MD   Signed by:   Roderick Pee MD on 04/22/2008   Method used:   Print then Give to Patient   RxID:   [id number] HYDROMET 5-1.5 MG/5ML SYRP (HYDROCODONE-HOMATROPINE) 1 or 2 tsp three times a day as  needed  #8oz x 2   Entered and Authorized by:   Roderick Pee MD   Signed by:   Roderick Pee MD on 04/22/2008   Method used:   Print then Give to Patient   RxID:   [id number] PREDNISONE 20 MG TABS (PREDNISONE) UAD  #50 x 1   Entered and Authorized by:   Roderick Pee MD   Signed by:   Roderick Pee MD on 04/22/2008   Method used:   Print then Give to Patient   RxID:   [id number] FLONASE 50 MCG/ACT SUSP (FLUTICASONE PROPIONATE) one shot R&L nose qhs  #2 x 6   Entered and Authorized by:   Roderick Pee MD   Signed by:   Roderick Pee MD on 04/22/2008   Method used:   Print then Give to Patient   RxID:   [id number]  ]  Tetanus/Td Immunization History:    Tetanus/Td # 1:  Historical (01/22/2003)   Laboratory Results   Urine Tests    Routine Urinalysis   Color: yellow Appearance: Clear Glucose: negative   (Normal Range: Negative) Bilirubin: negative   (Normal Range: Negative) Ketone: negative   (Normal Range: Negative) Spec. Gravity: 1.025   (Normal Range: 1.003-1.035) Blood: negative   (Normal Range: Negative) pH: 5.0   (Normal Range: 5.0-8.0) Protein: negative   (Normal Range: Negative) Urobilinogen: 0.2   (Normal Range: 0-1) Nitrite: negative   (Normal Range: Negative) Leukocyte Esterace: negative   (Normal Range: Negative)    Comments: Rita Ohara  April 22, 2008 11:35 AM

## 2010-06-30 NOTE — Assessment & Plan Note (Signed)
Summary: consult re: asthma/cjr   Vital Signs:  Patient profile:   40 year old male Weight:      240 pounds BMI:     36.62 Temp:     97.9 degrees F oral BP sitting:   142 / 88  (left arm) Cuff size:   regular  Vitals Entered By: Kern Reap CMA Duncan Dull) (February 18, 2009 9:07 AM)  Reason for Visit asthma concerns  History of Present Illness: Edwin Nelson is a 40 year old male employed at [employer] in [place], who comes in today for evaluation of two problems.  Over the past, year.  He's noticed increased shortness of breath with exertion.  He has a history of allergic rhinitis.  He states triggers can be shoveling snow, change in temperature, cold air, cycling, and now even walking.  He also noticed some wheezing.  He said no history of cardiac or pulmonary disease is not been smoking now for over two years.  He does have a history of allergic rhinitis.  He's also had issues in the past with wheezing when he gets a bad cold.  His BP was 142/88 repeat by me 150/84.  His father has hypertension.  In the, past.  He's had elevated blood pressures, but after home monitoring his blood pressure as dropped back to normal.  He states his wife also has asthma and she takes an inhaled steroid  and albuterol.  Allergies (verified): No Known Drug Allergies  Past History:  Past medical, surgical, family and social histories (including risk factors) reviewed for relevance to current acute and chronic problems.  Past Medical History: Reviewed history from 04/22/2008 and no changes required. Hyperlipidemia Allergic rhinitis Nephrolithiasis, hx of  Past Surgical History: Reviewed history from 04/22/2008 and no changes required. Tonsillectomy  Family History: Reviewed history from 04/22/2008 and no changes required. father had an MI at age 99 with underlying obesity, diabetes, hyperlipidemia, and smoking.  Mother in good health except for high blood pressure.  A younger  brother, Jonny Ruiz has hyperlipidemia  Social History: Reviewed history from 04/22/2008 and no changes required. Occupation: Married Former Smoker Alcohol use-no Drug use-no Regular exercise-no  Review of Systems      See HPI  Physical Exam  General:  Well-developed,well-nourished,in no acute distress; alert,appropriate and cooperative throughout examination Neck:  No deformities, masses, or tenderness noted. Chest Wall:  No deformities, masses, tenderness or gynecomastia noted. Breasts:  No masses or gynecomastia noted Lungs:  Normal respiratory effort, chest expands symmetrically. Lungs are clear to auscultation, no crackles or wheezes. Heart:  Normal rate and regular rhythm. S1 and S2 normal without gallop, murmur, click, rub or other extra sounds.   Impression & Recommendations:  Problem # 1:  ALLERGIC RHINITIS (ICD-477.9) Assessment Improved  His updated medication list for this problem includes:    Flonase 50 Mcg/act Susp (Fluticasone propionate) ..... 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 Uad His updated medication list for this problem includes:    Qvar 40 Mcg/act Aers (Beclomethasone dipropionate) .Marland Kitchen... 1 puff two times a day    Ventolin Hfa 108 (90 Base) Mcg/act Aers (Albuterol sulfate) .Marland Kitchen... 2 puffs 30 min prior to exercise  Orders: Prescription Created Electronically [phone])  Complete Medication  List: 1)  Crestor 20 Mg Tabs (Rosuvastatin calcium) .... Take one tab once daily 2)  Flonase 50 Mcg/act Susp (Fluticasone propionate) .... One shot r&l nose qhs 3)  Qvar 40 Mcg/act Aers (Beclomethasone dipropionate) .Marland Kitchen.. 1 puff two times a day 4)   Ventolin Hfa 108 (90 Base) Mcg/act Aers (Albuterol sulfate) .... 2 puffs 30 min prior to exercise  Patient Instructions: 1)  take plain Claritin, or plain Zyrtec daily. 2)  Begin Qvar one puff b.i.d. and Ventolin one or two puffs one hour prior to exercise. 3)  Check her blood pressure daily. 4)  Return in 3 weeks for follow-up Prescriptions: VENTOLIN HFA 108 (90 BASE) MCG/ACT AERS (ALBUTEROL SULFATE) 2 puffs 30 min prior to exercise  #1 x 2   Entered and Authorized by:   Roderick Pee MD   Signed by:   Roderick Pee MD on 02/18/2009   Method used:   Print then Give to Patient   RxID:   [id number] QVAR 40 MCG/ACT AERS (BECLOMETHASONE DIPROPIONATE) 1 puff two times a day  #3 x 4   Entered and Authorized by:   Roderick Pee MD   Signed by:   Roderick Pee MD on 02/18/2009   Method used:   Print then Give to Patient   RxID:   [id number]

## 2010-06-30 NOTE — Progress Notes (Signed)
Summary: refills  Phone Note Refill Request Message from:  Fax from Pharmacy on June 04, 2009 1:01 PM  Refills Requested: Medication #1:  CRESTOR 20 MG TABS take one tab once daily Initial call taken by: Kern Reap CMA (AAMA),  June 04, 2009 1:01 PM    Prescriptions: CRESTOR 20 MG TABS (ROSUVASTATIN CALCIUM) take one tab once daily  #100 x 3   Entered by:   Kern Reap CMA (AAMA)   Authorized by:   Roderick Pee MD   Signed by:   Kern Reap CMA (AAMA) on 06/04/2009   Method used:   Electronically to        [facility]* (mail-order)             ,          Ph: [phone]       Fax: [phone]   RxID:   [id number]

## 2010-06-30 NOTE — Progress Notes (Signed)
Summary: pt made appt please refill  Phone Note Call from Patient   Caller: PT LIVE Call For: Emersen Mascari Summary of Call: CRESTOR REFILL PLEASE ADVISE PATIENT [id number] STATUS  Initial call taken by: Roselle Locus,  March 22, 2008 3:39 PM  Follow-up for Phone Call        crestor was denied because patient has not been seen since 3/08.  if he would like to schedule a cpx or a office visit i will refill one time. left message on machine for patient. Follow-up by: Kern Reap CMA,  March 25, 2008 11:14 AM  Additional Follow-up for Phone Call Additional follow up Details #1::        He moved to [place] and it is 2 and 1/2 hour drive into town.   He made appt for 11-23 to come see Dr Tawanna Cooler.  Please call in his Crestor to [facility] [place] [place] [phone] Additional Follow-up by: Roselle Locus,  March 25, 2008 11:27 AM    Additional Follow-up for Phone Call Additional follow up Details #2::    rx called in Follow-up by: Kern Reap CMA,  March 25, 2008 12:29 PM    Prescriptions: CRESTOR 20 MG  TABS (ROSUVASTATIN CALCIUM) at bedtime  #30 x 0   Entered by:   Kern Reap CMA   Authorized by:   Roderick Pee MD   Signed by:   Kern Reap CMA on 03/25/2008   Method used:   Historical   RxID:   [id number]

## 2010-06-30 NOTE — Assessment & Plan Note (Signed)
Summary: 1 wk rov/njr pt rsc/njr   Vital Signs:  Patient profile:   40 year old male Weight:      239 pounds Temp:     98.6 degrees F oral BP sitting:   120 / 86  (left arm) Cuff size:   regular  Vitals Entered By: Kern Reap CMA Duncan Dull) (March 20, 2009 9:59 AM)  Reason for Visit follow up office visit  History of Present Illness: [name] is a 39 year old male, married, nonsmoker, and comes in today for evaluation of hypertension, and asthma.  We start him on Zestril, 10 mg daily BP now 120/80.  No side effect from medicine.  He is on Qvar 41-cup b.i.d. with Flonase nasal spray, and allergies, and asthma, under good control.  Again, no side effect from medication.  Review of systems negative  Allergies: No Known Drug Allergies  Past History:  Past medical, surgical, family and social histories (including risk factors) reviewed for relevance to current acute and chronic problems.  Past Medical History: Reviewed history from 03/11/2009 and no changes required. Hyperlipidemia Allergic rhinitis Nephrolithiasis, hx of Hypertension  Past Surgical History: Reviewed history from 04/22/2008 and no changes required. Tonsillectomy  Family History: Reviewed history from 04/22/2008 and no changes required. father had an MI at age 20 with underlying obesity, diabetes, hyperlipidemia, and smoking.  Mother in good health except for high blood pressure.  A younger brother, Jonny Ruiz has hyperlipidemia  Social History: Reviewed history from 04/22/2008 and no changes required. Occupation: Married Former Smoker Alcohol use-no Drug use-no Regular exercise-no  Review of Systems      See HPI  Physical Exam  General:  Well-developed,well-nourished,in no acute distress; alert,appropriate and cooperative throughout examination Heart:  120/80 left arm sitting position   Impression & Recommendations:  Problem # 1:  HYPERTENSION (ICD-401.9) Assessment Improved  His updated  medication list for this problem includes:    Zestril 10 Mg Tabs (Lisinopril) .Marland Kitchen... Take 1 tablet by mouth every morning  Orders: Prescription Created Electronically [phone]) Venipuncture (13086) TLB-BMP (Basic Metabolic Panel-BMET) (80048-METABOL)  Problem # 2:  EXTRINSIC ASTHMA, UNSPECIFIED (ICD-493.00) Assessment: Improved  His updated medication list for this problem includes:    Qvar 40 Mcg/act Aers (Beclomethasone dipropionate) .Marland Kitchen... 1 puff two times a day    Ventolin Hfa 108 (90 Base) Mcg/act Aers (Albuterol sulfate) .Marland Kitchen... 2 puffs 30 min prior to exercise    Proventil Hfa 108 (90 Base) Mcg/act Aers (Albuterol sulfate)  Orders: Prescription Created Electronically [phone])  Complete Medication List: 1)  Crestor 20 Mg Tabs (Rosuvastatin calcium) .... Take one tab once daily 2)  Flonase 50 Mcg/act Susp (Fluticasone propionate) .... One shot r&l nose qhs 3)  Qvar 40 Mcg/act Aers (Beclomethasone dipropionate) .Marland Kitchen.. 1 puff two times a day 4)  Ventolin Hfa 108 (90 Base) Mcg/act Aers (Albuterol sulfate) .... 2 puffs 30 min prior to exercise 5)  Zestril 10 Mg Tabs (Lisinopril) .... Take 1 tablet by mouth every morning 6)  Proventil Hfa 108 (90 Base) Mcg/act Aers (Albuterol sulfate)  Patient Instructions: 1)  continue current treatment program.  I will call you on with y  Lab work .    Check your blood pressure weekly.  The goal is to keep the systolic 135 or less and the diastolic 85 or less. 2)  If you see a consistent elevation over a two week period of time, then, call we can readjust her medication by phone .  Going forward.  Will need to see yearly for your  annual exam

## 2010-07-02 NOTE — Assessment & Plan Note (Signed)
Summary: CPX/PT COMING IN FASTING/CJR   Vital Signs:  Patient profile:   40 year old male Height:      68 inches Weight:      215 pounds BMI:     32.81 Temp:     98.1 degrees F oral BP sitting:   120 / 84  (left arm) Cuff size:   regular  Vitals Entered By: Kern Reap CMA Duncan Dull) (June 08, 2010 9:25 AM) CC: cpx Is Patient Diabetic? No Pain Assessment Patient in pain? no        CC:  cpx.  History of Present Illness: Edwin Nelson  is a 40 year old male, married, nonsmoker who lives in [place], who comes in today for a new physical examination  He takes Crestor 20 mg nightly for hyperlipidemia, steroid nasal spray, and Claritin daily.  Also, inhaled steroid Qvar 81-cup b.i.d.  Asthma on this medication is quiet.  he also takes an aspirin tablet daily 10 mg of Zestril and Proventil only p.r.n.  Review of systems negative except used and a weight loss diet.  Tetanus 2004, seasonal flu 2011,  Allergies: No Known Drug Allergies  Past History:  Past medical, surgical, family and social histories (including risk factors) reviewed, and no changes noted (except as noted below).  Past Medical History: Reviewed history from 03/11/2009 and no changes required. Hyperlipidemia Allergic rhinitis Nephrolithiasis, hx of Hypertension  Past Surgical History: Reviewed history from 04/22/2008 and no changes required. Tonsillectomy  Family History: Reviewed history from 04/22/2008 and no changes required. father had an MI at age 74 with underlying obesity, diabetes, hyperlipidemia, and smoking.  Mother in good health except for high blood pressure.  A younger brother, Jonny Ruiz has hyperlipidemia  Social History: Reviewed history from 04/22/2008 and no changes required. Occupation: Married Former Smoker Alcohol use-no Drug use-no Regular exercise-no  Review of Systems      See HPI  Physical Exam  General:  Well-developed,well-nourished,in no acute distress;  alert,appropriate and cooperative throughout examination Head:  Normocephalic and atraumatic without obvious abnormalities. No apparent alopecia or balding. Eyes:  No corneal or conjunctival inflammation noted. EOMI. Perrla. Funduscopic exam benign, without hemorrhages, exudates or papilledema. Vision grossly normal. Ears:  External ear exam shows no significant lesions or deformities.  Otoscopic examination reveals clear canals, tympanic membranes are intact bilaterally without bulging, retraction, inflammation or discharge. Hearing is grossly normal bilaterally. Nose:  External nasal examination shows no deformity or inflammation. Nasal mucosa are pink and moist without lesions or exudates. Mouth:  Oral mucosa and oropharynx without lesions or exudates.  Teeth in good repair. Neck:  No deformities, masses, or tenderness noted. Chest Wall:  No deformities, masses, tenderness or gynecomastia noted. Breasts:  No masses or gynecomastia noted Lungs:  Normal respiratory effort, chest expands symmetrically. Lungs are clear to auscultation, no crackles or wheezes. Heart:  Normal rate and regular rhythm. S1 and S2 normal without gallop, murmur, click, rub or other extra sounds. Abdomen:  Bowel sounds positive,abdomen soft and non-tender without masses, organomegaly or hernias noted. Rectal:  No external abnormalities noted. Normal sphincter tone. No rectal masses or tenderness. Genitalia:  Testes bilaterally descended without nodularity, tenderness or masses. No scrotal masses or lesions. No penis lesions or urethral discharge. Prostate:  Prostate gland firm and smooth, no enlargement, nodularity, tenderness, mass, asymmetry or induration. Msk:  No deformity or scoliosis noted of thoracic or lumbar spine.   Pulses:  R and L carotid,radial,femoral,dorsalis pedis and posterior tibial pulses are full and equal bilaterally Extremities:  No clubbing, cyanosis, edema, or deformity noted with normal full range of  motion of all joints.   Neurologic:  No cranial nerve deficits noted. Station and gait are normal. Plantar reflexes are down-going bilaterally. DTRs are symmetrical throughout. Sensory, motor and coordinative functions appear intact. Skin:  Intact without suspicious lesions or rashes Cervical Nodes:  No lymphadenopathy noted Axillary Nodes:  No palpable lymphadenopathy Inguinal Nodes:  No significant adenopathy Psych:  Cognition and judgment appear intact. Alert and cooperative with normal attention span and concentration. No apparent delusions, illusions, hallucinations   Impression & Recommendations:  Problem # 1:  HYPERTENSION (ICD-401.9) Assessment Improved  His updated medication list for this problem includes:    Zestril 10 Mg Tabs (Lisinopril) .Marland Kitchen... Take 1 tablet by mouth every morning  Orders: Venipuncture (98119) TLB-Lipid Panel (80061-LIPID) TLB-BMP (Basic Metabolic Panel-BMET) (80048-METABOL) TLB-CBC Platelet - w/Differential (85025-CBCD) TLB-Hepatic/Liver Function Pnl (80076-HEPATIC) TLB-TSH (Thyroid Stimulating Hormone) (14782-NFA) Prescription Created Electronically [phone]) Cerumen Impaction Removal (65784) UA Dipstick w/o Micro (automated)  (81003) EKG w/ Interpretation (93000)  Problem # 2:  EXTRINSIC ASTHMA, UNSPECIFIED (ICD-493.00) Assessment: Improved  His updated medication list for this problem includes:    Proventil Hfa 108 (90 Base) Mcg/act Aers (Albuterol sulfate) .Marland Kitchen... 2 puff three times a day as needed    Qvar 80 Mcg/act Aers (Beclomethasone dipropionate) .Marland Kitchen... 1 puff two times a day  Orders: Venipuncture (69629) TLB-Lipid Panel (80061-LIPID) TLB-BMP (Basic Metabolic Panel-BMET) (80048-METABOL) TLB-CBC Platelet - w/Differential (85025-CBCD) TLB-Hepatic/Liver Function Pnl (80076-HEPATIC) TLB-TSH (Thyroid Stimulating Hormone) (52841-LKG) Prescription Created Electronically [phone] Cerumen Impaction Removal (72536) UA Dipstick w/o Micro (automated)   (81003)  Problem # 3:  HYPERLIPIDEMIA (ICD-272.4) Assessment: Improved  His updated medication list for this problem includes:    Crestor 20 Mg Tabs (Rosuvastatin calcium) .Marland Kitchen... Take one tab once daily  Orders: Venipuncture (64403) TLB-Lipid Panel (80061-LIPID) TLB-BMP (Basic Metabolic Panel-BMET) (80048-METABOL) TLB-CBC Platelet - w/Differential (85025-CBCD) TLB-Hepatic/Liver Function Pnl (80076-HEPATIC) TLB-TSH (Thyroid Stimulating Hormone) (47425-ZDG) Prescription Created Electronically [phone]) Cerumen Impaction Removal (43329) UA Dipstick w/o Micro (automated)  (81003) EKG w/ Interpretation (93000)  Problem # 4:  Preventive Health Care (ICD-V70.0) Assessment: Unchanged  Complete Medication List: 1)  Crestor 20 Mg Tabs (Rosuvastatin calcium) .... Take one tab once daily 2)  Flonase 50 Mcg/act Susp (Fluticasone propionate) .... One shot r&l nose qhs 3)  Zestril 10 Mg Tabs (Lisinopril) .... Take 1 tablet by mouth every morning 4)  Proventil Hfa 108 (90 Base) Mcg/act Aers (Albuterol sulfate) .... 2 puff three times a day as needed 5)  Aspirin 81 Mg Tbec (Aspirin) .... Once daily 6)  Daily Vitamins Tabs (Multiple vitamin) .... Once daily 7)  Claritin 10 Mg Tabs (Loratadine) .... Take one tab once daily 8)  Qvar 80 Mcg/act Aers (Beclomethasone dipropionate) .Marland Kitchen.. 1 puff two times a day  Other Orders: Pneumococcal Vaccine (51884) Admin 1st Vaccine (16606) Specimen Handling (30160)  Patient Instructions: 1)  Please schedule a follow-up appointment in 1 year. Prescriptions: QVAR 80 MCG/ACT AERS (BECLOMETHASONE DIPROPIONATE) 1 puff two times a day  #3 units x 3   Entered and Authorized by:   Roderick Pee MD   Signed by:   Roderick Pee MD on 06/08/2010   Method used:   Electronically to        [facility]* (retail)             ,          Ph: [phone]       Fax: [phone]  RxID:   [id number] PROVENTIL HFA 108 (90 BASE) MCG/ACT AERS (ALBUTEROL SULFATE) 2  puff three times a day as needed  #1 x 1   Entered and Authorized by:   Roderick Pee MD   Signed by:   Roderick Pee MD on 06/08/2010   Method used:   Electronically to        [facility]* (retail)             ,          Ph: [phone]       Fax: [phone]   RxID:   [id number] ZESTRIL 10 MG TABS (LISINOPRIL) Take 1 tablet by mouth every morning  #100 x 3   Entered and Authorized by:   Roderick Pee MD   Signed by:   Roderick Pee MD on 06/08/2010   Method used:   Electronically to        [facility]* (retail)             ,          Ph: [phone]       Fax: [phone]   RxID:   [id number] FLONASE 50 MCG/ACT SUSP (FLUTICASONE PROPIONATE) one shot R&L nose qhs  #3 units x 3   Entered and Authorized by:   Roderick Pee MD   Signed by:   Roderick Pee MD on 06/08/2010   Method used:   Electronically to        [facility]* (retail)             ,          Ph: [phone]       Fax: [phone]   RxID:   [id number] CRESTOR 20 MG TABS (ROSUVASTATIN CALCIUM) take one tab once daily  #100 x 3   Entered and Authorized by:   Roderick Pee MD   Signed by:   Roderick Pee MD on 06/08/2010   Method used:   Electronically to        [facility]* (retail)             ,          Ph: [phone]       Fax: [phone]   RxID:   [id number]    Orders Added: 1)  Venipuncture [77824] 2)  TLB-Lipid Panel [80061-LIPID] 3)  TLB-BMP (Basic Metabolic Panel-BMET) [80048-METABOL] 4)  TLB-CBC Platelet - w/Differential [85025-CBCD] 5)  TLB-Hepatic/Liver Function Pnl [80076-HEPATIC] 6)  TLB-TSH (Thyroid Stimulating Hormone) [84443-TSH] 7)  Prescription Created Electronically [G8553] 8)  Est. Patient 18-39 years [99395] 9)  Cerumen Impaction Removal [69210] 10)  UA Dipstick w/o Micro (automated)  [81003] 11)  EKG w/ Interpretation [93000] 12)  Pneumococcal Vaccine [90732] 13)  Admin 1st Vaccine [90471] 14)  Specimen Handling  [99000]   Immunization History:  Influenza Immunization History:    Influenza:  historical (02/28/2010)  Immunizations Administered:  Pneumonia Vaccine:    Vaccine Type: Pneumovax    Site: right deltoid    Mfr: Merck    Dose: 0.5 ml    Route: IM    Given by: Kern Reap CMA (AAMA)    Exp. Date: 09/30/2011    Lot #: [id number]    VIS given: 05/05/09 version given June 08, 2010.    Physician counseled: yes   Immunization History:  Influenza Immunization History:    Influenza:  Historical (02/28/2010)  Immunizations Administered:  Pneumonia Vaccine:  Vaccine Type: Pneumovax    Site: right deltoid    Mfr: Merck    Dose: 0.5 ml    Route: IM    Given by: Kern Reap CMA (AAMA)    Exp. Date: 09/30/2011    Lot #: [id number]    VIS given: 05/05/09 version given June 08, 2010.    Physician counseled: yes    Laboratory Results   Urine Tests    Routine Urinalysis   Color: yellow Appearance: Clear Glucose: negative   (Normal Range: Negative) Bilirubin: 1+   (Normal Range: Negative) Ketone: 2+   (Normal Range: Negative) Spec. Gravity: 1.020   (Normal Range: 1.003-1.035) Blood: negative   (Normal Range: Negative) pH: 5.0   (Normal Range: 5.0-8.0) Protein: negative   (Normal Range: Negative) Urobilinogen: 0.2   (Normal Range: 0-1) Nitrite: negative   (Normal Range: Negative) Leukocyte Esterace: negative   (Normal Range: Negative)    Comments: Rita Ohara  June 08, 2010 11:28 AM

## 2011-06-07 ENCOUNTER — Other Ambulatory Visit: Payer: Self-pay | Admitting: Family Medicine

## 2011-06-09 ENCOUNTER — Telehealth: Payer: Self-pay | Admitting: Family Medicine

## 2011-06-09 NOTE — Telephone Encounter (Signed)
error 

## 2011-07-20 ENCOUNTER — Ambulatory Visit (INDEPENDENT_AMBULATORY_CARE_PROVIDER_SITE_OTHER): Payer: BC Managed Care – PPO | Admitting: Family Medicine

## 2011-07-20 ENCOUNTER — Encounter: Payer: Self-pay | Admitting: Family Medicine

## 2011-07-20 DIAGNOSIS — J309 Allergic rhinitis, unspecified: Secondary | ICD-10-CM

## 2011-07-20 DIAGNOSIS — J45901 Unspecified asthma with (acute) exacerbation: Secondary | ICD-10-CM

## 2011-07-20 DIAGNOSIS — E785 Hyperlipidemia, unspecified: Secondary | ICD-10-CM

## 2011-07-20 DIAGNOSIS — IMO0001 Reserved for inherently not codable concepts without codable children: Secondary | ICD-10-CM

## 2011-07-20 DIAGNOSIS — I10 Essential (primary) hypertension: Secondary | ICD-10-CM

## 2011-07-20 DIAGNOSIS — R030 Elevated blood-pressure reading, without diagnosis of hypertension: Secondary | ICD-10-CM

## 2011-07-20 LAB — POCT URINALYSIS DIPSTICK
Bilirubin, UA: NEGATIVE
Blood, UA: NEGATIVE
Glucose, UA: NEGATIVE
Leukocytes, UA: NEGATIVE
Nitrite, UA: NEGATIVE
Protein, UA: NEGATIVE
Spec Grav, UA: 1.025
Urobilinogen, UA: 0.2
pH, UA: 5.5

## 2011-07-20 MED ORDER — LISINOPRIL 10 MG PO TABS: 10.0000 mg | ORAL_TABLET | Freq: Every day | ORAL | Status: DC

## 2011-07-20 MED ORDER — FLUTICASONE PROPIONATE 50 MCG/ACT NA SUSP: 1.0000 | Freq: Two times a day (BID) | NASAL | Status: DC

## 2011-07-20 MED ORDER — BECLOMETHASONE DIPROPIONATE 80 MCG/ACT IN AERS: 1.0000 | INHALATION_SPRAY | Freq: Two times a day (BID) | RESPIRATORY_TRACT | Status: DC

## 2011-07-20 MED ORDER — ROSUVASTATIN CALCIUM 20 MG PO TABS: 20.0000 mg | ORAL_TABLET | Freq: Every day | ORAL | Status: DC

## 2011-07-20 NOTE — Patient Instructions (Signed)
Continue your current medications  Followup in 1 year sooner if any problems  Begin a diet and exercise program to reduce weight

## 2011-07-20 NOTE — Progress Notes (Signed)
  Subjective:    Patient ID: Edwin Nelson, male    DOB: 02-25-1971, 41 y.o.   MRN: [id number]  HPI  Edwin Nelson is a 41 year old married male nonsmoker who comes in today for physical evaluation because of a history of hyperlipidemia, allergic rhinitis, hypertension, and chronic asthma  He takes Crestor 20 mg daily a baby aspirin check lipid panel today  He uses steroid nasal spray for allergic rhinitis  He takes lisinopril 10 mg daily for hypertension BP today 140/90 he'll do some blood pressure monitoring at home to be sure it's at goal  He uses Qvar 80 one puff twice a day and occasional albuterol when necessary  Social history he works and lives now in [place] works in computers his wife works at [employer]  Review of Systems  Constitutional: Negative.   HENT: Negative.   Eyes: Negative.   Respiratory: Negative.   Cardiovascular: Negative.   Gastrointestinal: Negative.   Genitourinary: Negative.   Musculoskeletal: Negative.   Skin: Negative.   Neurological: Negative.   Hematological: Negative.   Psychiatric/Behavioral: Negative.        Objective:   Physical Exam  Constitutional: He is oriented to person, place, and time. He appears well-developed and well-nourished.  HENT:  Head: Normocephalic and atraumatic.  Right Ear: External ear normal.  Left Ear: External ear normal.  Nose: Nose normal.  Mouth/Throat: Oropharynx is clear and moist.  Eyes: Conjunctivae and EOM are normal. Pupils are equal, round, and reactive to light.  Neck: Normal range of motion. Neck supple. No JVD present. No tracheal deviation present. No thyromegaly present.  Cardiovascular: Normal rate, regular rhythm, normal heart sounds and intact distal pulses.  Exam reveals no gallop and no friction rub.   No murmur heard. Pulmonary/Chest: Effort normal and breath sounds normal. No stridor. No respiratory distress. He has no wheezes. He has no rales. He exhibits no tenderness.  Abdominal:  Soft. Bowel sounds are normal. He exhibits no distension and no mass. There is no tenderness. There is no rebound and no guarding.  Genitourinary: Rectum normal, prostate normal and penis normal. Guaiac negative stool. No penile tenderness.  Musculoskeletal: Normal range of motion. He exhibits no edema and no tenderness.  Lymphadenopathy:    He has no cervical adenopathy.  Neurological: He is alert and oriented to person, place, and time. He has normal reflexes. No cranial nerve deficit. He exhibits normal muscle tone.  Skin: Skin is warm and dry. No rash noted. No erythema. No pallor.  Psychiatric: He has a normal mood and affect. His behavior is normal. Judgment and thought content normal.          Assessment & Plan:  Healthy male  Hyperlipidemia continue Crestor 20 mg daily and an aspirin check lipid panel  Allergic rhinitis continue steroid nasal spray twice a day  Hypertension continue lisinopril 10 mg daily  Asthma continue Qvar 80 one puff twice a day albuterol when necessary  Return in one year sooner if any problems

## 2011-07-21 LAB — CBC
HCT: 43.2 % (ref 39.0–52.0)
Hemoglobin: 14.7 g/dL (ref 13.0–17.0)
MCHC: 34.1 g/dL (ref 30.0–36.0)
MCV: 93.6 fl (ref 78.0–100.0)
Platelets: 179 10*3/uL (ref 150.0–400.0)
RBC: 4.62 Mil/uL (ref 4.22–5.81)
RDW: 14.1 % (ref 11.5–14.6)
WBC: 4.3 10*3/uL — ABNORMAL LOW (ref 4.5–10.5)

## 2011-07-21 LAB — LIPID PANEL
Cholesterol: 210 mg/dL — ABNORMAL HIGH (ref 0–200)
HDL: 59.3 mg/dL (ref >39.00)
Total CHOL/HDL Ratio: 4
Triglycerides: 256 mg/dL — ABNORMAL HIGH (ref 0.0–149.0)
VLDL: 51.2 mg/dL — ABNORMAL HIGH (ref 0.0–40.0)

## 2011-07-21 LAB — BASIC METABOLIC PANEL
BUN: 15 mg/dL (ref 6–23)
CO2: 25 mEq/L (ref 19–32)
Calcium: 10 mg/dL (ref 8.4–10.5)
Chloride: 102 mEq/L (ref 96–112)
Creatinine, Ser: 0.9 mg/dL (ref 0.4–1.5)
GFR: 102.09 mL/min (ref >60.00)
Glucose, Bld: 97 mg/dL (ref 70–99)
Potassium: 4.6 mEq/L (ref 3.5–5.1)
Sodium: 139 mEq/L (ref 135–145)

## 2011-07-21 LAB — HEPATIC FUNCTION PANEL
ALT: 60 U/L — ABNORMAL HIGH (ref 0–53)
AST: 41 U/L — ABNORMAL HIGH (ref 0–37)
Albumin: 4.9 g/dL (ref 3.5–5.2)
Alkaline Phosphatase: 57 U/L (ref 39–117)
Bilirubin, Direct: 0 mg/dL (ref 0.0–0.3)
Total Bilirubin: 0.5 mg/dL (ref 0.3–1.2)
Total Protein: 8.1 g/dL (ref 6.0–8.3)

## 2011-07-21 LAB — LDL CHOLESTEROL, DIRECT: Direct LDL: 74.7 mg/dL

## 2011-07-21 LAB — TSH: TSH: 2.2 u[IU]/mL (ref 0.35–5.50)

## 2012-07-20 ENCOUNTER — Other Ambulatory Visit: Payer: Self-pay | Admitting: Family Medicine

## 2012-08-14 ENCOUNTER — Encounter: Payer: BC Managed Care – PPO | Admitting: Family Medicine

## 2012-08-16 ENCOUNTER — Encounter: Payer: BC Managed Care – PPO | Admitting: Family Medicine

## 2012-08-23 ENCOUNTER — Encounter: Payer: Self-pay | Admitting: Family

## 2012-08-23 ENCOUNTER — Ambulatory Visit (INDEPENDENT_AMBULATORY_CARE_PROVIDER_SITE_OTHER): Payer: BC Managed Care – PPO | Admitting: Family

## 2012-08-23 VITALS — BP 120/80 | HR 72 | Ht 68.0 in | Wt 240.0 lb

## 2012-08-23 DIAGNOSIS — I10 Essential (primary) hypertension: Secondary | ICD-10-CM

## 2012-08-23 DIAGNOSIS — Z23 Encounter for immunization: Secondary | ICD-10-CM

## 2012-08-23 DIAGNOSIS — E7800 Pure hypercholesterolemia, unspecified: Secondary | ICD-10-CM

## 2012-08-23 DIAGNOSIS — J309 Allergic rhinitis, unspecified: Secondary | ICD-10-CM

## 2012-08-23 DIAGNOSIS — Z0000 Encounter for general adult medical examination without abnormal findings: Secondary | ICD-10-CM

## 2012-08-23 DIAGNOSIS — J45909 Unspecified asthma, uncomplicated: Secondary | ICD-10-CM

## 2012-08-23 LAB — COMPREHENSIVE METABOLIC PANEL
ALT: 75 U/L — ABNORMAL HIGH (ref 0–53)
AST: 51 U/L — ABNORMAL HIGH (ref 0–37)
Albumin: 4.4 g/dL (ref 3.5–5.2)
Alkaline Phosphatase: 67 U/L (ref 39–117)
BUN: 14 mg/dL (ref 6–23)
CO2: 25 mEq/L (ref 19–32)
Calcium: 10.1 mg/dL (ref 8.4–10.5)
Chloride: 100 mEq/L (ref 96–112)
Creatinine, Ser: 1 mg/dL (ref 0.4–1.5)
GFR: 83.73 mL/min (ref >60.00)
Glucose, Bld: 129 mg/dL — ABNORMAL HIGH (ref 70–99)
Potassium: 4.4 mEq/L (ref 3.5–5.1)
Sodium: 136 mEq/L (ref 135–145)
Total Bilirubin: 0.6 mg/dL (ref 0.3–1.2)
Total Protein: 7.8 g/dL (ref 6.0–8.3)

## 2012-08-23 LAB — CBC WITH DIFFERENTIAL/PLATELET
Basophils Absolute: 0 10*3/uL (ref 0.0–0.1)
Basophils Relative: 0.7 % (ref 0.0–3.0)
Eosinophils Absolute: 0 10*3/uL (ref 0.0–0.7)
Eosinophils Relative: 0.8 % (ref 0.0–5.0)
HCT: 43.2 % (ref 39.0–52.0)
Hemoglobin: 14.8 g/dL (ref 13.0–17.0)
Lymphocytes Relative: 38.3 % (ref 12.0–46.0)
Lymphs Abs: 1.7 10*3/uL (ref 0.7–4.0)
MCHC: 34.3 g/dL (ref 30.0–36.0)
MCV: 90.7 fl (ref 78.0–100.0)
Monocytes Absolute: 0.7 10*3/uL (ref 0.1–1.0)
Monocytes Relative: 15.7 % — ABNORMAL HIGH (ref 3.0–12.0)
Neutro Abs: 2 10*3/uL (ref 1.4–7.7)
Neutrophils Relative %: 44.5 % (ref 43.0–77.0)
Platelets: 236 10*3/uL (ref 150.0–400.0)
RBC: 4.76 Mil/uL (ref 4.22–5.81)
RDW: 14.1 % (ref 11.5–14.6)
WBC: 4.5 10*3/uL (ref 4.5–10.5)

## 2012-08-23 LAB — LIPID PANEL
Cholesterol: 185 mg/dL (ref 0–200)
HDL: 47.8 mg/dL (ref >39.00)
Total CHOL/HDL Ratio: 4
Triglycerides: 401 mg/dL — ABNORMAL HIGH (ref 0.0–149.0)
VLDL: 80.2 mg/dL — ABNORMAL HIGH (ref 0.0–40.0)

## 2012-08-23 LAB — PSA: PSA: 0.49 ng/mL (ref 0.10–4.00)

## 2012-08-23 LAB — TSH: TSH: 2.47 u[IU]/mL (ref 0.35–5.50)

## 2012-08-23 LAB — LDL CHOLESTEROL, DIRECT: Direct LDL: 60.1 mg/dL

## 2012-08-23 NOTE — Progress Notes (Signed)
Subjective:    Patient ID: Edwin Nelson, male    DOB: 08/22/1970, 42 y.o.   MRN: [id number]  HPI  Patient presents for yearly preventative medicine examination. He has a history of hypertension, hyperlipidemia, asthma and is tolerating his medications well. Denies any concerns.   All immunizations and health maintenance protocols were reviewed with the patient and they are up to date with these protocols.   screening laboratory values were reviewed with the patient including screening of hyperlipidemia PSA renal function and hepatic function.   There medications past medical history social history problem list and allergies were reviewed in detail.   Goals were established with regard to weight loss exercise diet in compliance with medications   Review of Systems  Constitutional: Negative.   HENT: Negative.   Eyes: Negative.   Respiratory: Negative.   Cardiovascular: Negative.   Gastrointestinal: Negative.   Endocrine: Negative.   Genitourinary: Negative.   Musculoskeletal: Negative.   Skin: Negative.   Allergic/Immunologic: Positive for environmental allergies. Negative for food allergies.  Neurological: Negative.   Hematological: Negative.   Psychiatric/Behavioral: Negative.    History reviewed. No pertinent past medical history.  History   Social History  . Marital Status: Single    Spouse Name: N/A    Number of Children: N/A  . Years of Education: N/A   Occupational History  . Not on file.   Social History Main Topics  . Smoking status: Former Games developer  . Smokeless tobacco: Not on file  . Alcohol Use: Yes  . Drug Use: No  . Sexually Active:    Other Topics Concern  . Not on file   Social History Narrative  . No narrative on file    History reviewed. No pertinent past surgical history.  No family history on file.  No Known Allergies  Current Outpatient Prescriptions on File Prior to Visit  Medication Sig Dispense Refill  . fluticasone (FLONASE) 50  MCG/ACT nasal spray USE 1 SPRAY NASALLY TWICE A DAY  16 g  10  . lisinopril (PRINIVIL,ZESTRIL) 10 MG tablet TAKE 1 TABLET DAILY  100 tablet  2  . PROVENTIL HFA 108 (90 BASE) MCG/ACT inhaler INHALE 2 PUFFS THREE TIMES A DAY AS NEEDED  18 g  1  . QVAR 80 MCG/ACT inhaler INHALATION 1 PUFF INTO THE LUNGS TWICE A DAY  3 g  1  . rosuvastatin (CRESTOR) 20 MG tablet Take 1 tablet (20 mg total) by mouth daily.  100 tablet  2   No current facility-administered medications on file prior to visit.    BP 120/80  Pulse 72  Ht 5\' 8"  (1.727 m)  Wt 240 lb (108.863 kg)  BMI 36.5 kg/m2  SpO2 99%chart    Objective:   Physical Exam  Constitutional: He is oriented to person, place, and time. He appears well-developed and well-nourished.  HENT:  Head: Normocephalic.  Right Ear: External ear normal.  Left Ear: External ear normal.  Nose: Nose normal.  Mouth/Throat: Oropharynx is clear and moist.  Eyes: Conjunctivae and EOM are normal. Pupils are equal, round, and reactive to light.  Neck: Normal range of motion. Neck supple. No thyromegaly present.  Cardiovascular: Normal rate, regular rhythm and normal heart sounds.  Exam reveals no gallop.   No murmur heard. Pulmonary/Chest: Effort normal and breath sounds normal. No respiratory distress.  Genitourinary: Rectum normal, prostate normal and penis normal. Guaiac negative stool. No penile tenderness.  Musculoskeletal: Normal range of motion. He exhibits no edema and no  tenderness.  Neurological: He is alert and oriented to person, place, and time. He has normal reflexes.  Skin: Skin is warm and dry.  Psychiatric: He has a normal mood and affect.    Tetanus diphtheria pertusis administered       Assessment & Plan:  Assessment: 1. Complete physical exam 2. Hypertension 3. Hyperlipidemia 4. COPD  Plan: Lab sent to include CMP, lipids, CBC, TSH and PSA will notify patient pending results. Encouraged healthy diet, exercise, weight reduction.  Continue current medications. Patient to followup with Dr. Tawanna Cooler in 4-6 months and sooner as needed.

## 2012-08-23 NOTE — Patient Instructions (Addendum)
Exercise to Stay Healthy Exercise helps you become and stay healthy. EXERCISE IDEAS AND TIPS Choose exercises that:  You enjoy.  Fit into your day. You do not need to exercise really hard to be healthy. You can do exercises at a slow or medium level and stay healthy. You can:  Stretch before and after working out.  Try yoga, Pilates, or tai chi.  Lift weights.  Walk fast, swim, jog, run, climb stairs, bicycle, dance, or rollerskate.  Take aerobic classes. Exercises that burn about 150 calories:  Running 1  miles in 15 minutes.  Playing volleyball for 45 to 60 minutes.  Washing and waxing a car for 45 to 60 minutes.  Playing touch football for 45 minutes.  Walking 1  miles in 35 minutes.  Pushing a stroller 1  miles in 30 minutes.  Playing basketball for 30 minutes.  Raking leaves for 30 minutes.  Bicycling 5 miles in 30 minutes.  Walking 2 miles in 30 minutes.  Dancing for 30 minutes.  Shoveling snow for 15 minutes.  Swimming laps for 20 minutes.  Walking up stairs for 15 minutes.  Bicycling 4 miles in 15 minutes.  Gardening for 30 to 45 minutes.  Jumping rope for 15 minutes.  Washing windows or floors for 45 to 60 minutes. Document Released: 06/19/2010 Document Revised: 08/09/2011 Document Reviewed: 06/19/2010 Exit[employer] Patient Information 2013 [employer].  

## 2012-08-29 ENCOUNTER — Telehealth: Payer: Self-pay

## 2012-08-29 MED ORDER — FENOFIBRATE 145 MG PO TABS: 145.0000 mg | ORAL_TABLET | Freq: Every day | ORAL | Status: DC

## 2012-08-29 NOTE — Telephone Encounter (Signed)
Message copied by Beverely Low on Tue Aug 29, 2012  4:05 PM ------      Message from: Edwin Nelson      Created: Tue Aug 29, 2012  8:50 AM       Was patient truly fasting?? Trigs and blood sugar are elevated. May suggested not truly fasting or we need to add additional testing. ------

## 2012-08-29 NOTE — Telephone Encounter (Signed)
Pt notes that he was fasting. Pt will locate a local lab in [place] to have an a1c drawn per Padonda's request and Tricor 145mg  sent to pharmacy for trig manangement

## 2012-09-01 ENCOUNTER — Encounter: Payer: Self-pay | Admitting: Family

## 2012-09-03 ENCOUNTER — Encounter: Payer: Self-pay | Admitting: Family

## 2012-09-07 ENCOUNTER — Other Ambulatory Visit: Payer: Self-pay | Admitting: Family Medicine

## 2012-09-07 LAB — HEMOGLOBIN A1C: Hgb A1c MFr Bld: 6.3 % — AB (ref 4.0–6.0)

## 2012-09-18 ENCOUNTER — Encounter: Payer: Self-pay | Admitting: Family

## 2012-10-05 ENCOUNTER — Encounter: Payer: Self-pay | Admitting: Family Medicine

## 2013-04-05 ENCOUNTER — Other Ambulatory Visit: Payer: Self-pay

## 2013-04-06 ENCOUNTER — Encounter: Payer: Self-pay | Admitting: Family Medicine

## 2013-04-12 ENCOUNTER — Encounter: Payer: Self-pay | Admitting: Family Medicine

## 2013-04-12 ENCOUNTER — Ambulatory Visit (INDEPENDENT_AMBULATORY_CARE_PROVIDER_SITE_OTHER): Payer: BC Managed Care – PPO | Admitting: Family Medicine

## 2013-04-12 VITALS — BP 120/80 | Temp 98.0°F | Wt 240.0 lb

## 2013-04-12 DIAGNOSIS — Z23 Encounter for immunization: Secondary | ICD-10-CM

## 2013-04-12 DIAGNOSIS — E119 Type 2 diabetes mellitus without complications: Secondary | ICD-10-CM | POA: Insufficient documentation

## 2013-04-12 MED ORDER — METFORMIN HCL 500 MG PO TABS: ORAL_TABLET | ORAL | Status: DC

## 2013-04-12 NOTE — Progress Notes (Signed)
Pre visit review using our clinic review tool, if applicable. No additional management support is needed unless otherwise documented below in the visit note. 

## 2013-04-12 NOTE — Patient Instructions (Signed)
Work really really really hard on your diet,,,,,,,,,,,,,,, no sugar  Walk 30 minutes daily  Metformin 500 mg,,,,,,,, one half tab prior to breakfast  Fasting blood sugar daily in the morning  Followup in 2-3 weeks with your new primary care physician in [place]

## 2013-04-12 NOTE — Progress Notes (Signed)
  Subjective:    Patient ID: Edwin Nelson, male    DOB: [birth year], 42 y.o.   MRN: [id number]  HPI  Edwin Nelson is a 42 year old married male nonsmoker who comes in today for evaluation of some abnormal lab tests.  He has a history of underlying hypertension and hyperlipidemia and is on lisinopril 10 mg daily with good blood pressure control BP 120/80. He's also on Crestor 20 mg daily and an aspirin tablet because of hyperlipidemia.  He had some screening lab work. He was fasting at the time and his triglyceride level was 482 blood sugar 60 however his A1c was 6.8%.  His father recently died from a stroke. He had strokes prior and also had a heart attack many years ago. His father also was a diabetic.  He's moved to [place]  Review of Systems    review of systems otherwise negative Objective:   Physical Exam  Well-developed well-nourished slightly overweight male weight 240 pounds no acute distress      Assessment & Plan:  Hypertension at goal continue current therapy hyperlipidemia with a goog HDL LDL will need to be done down the road also triglycerides elevated because of the new onset diabetes and glucose intolerance  New-onset diabetes begin metformin 250 mg before breakfast exercise dietary program followup in [place] where he's moved

## 2013-04-20 ENCOUNTER — Encounter: Payer: Self-pay | Admitting: Family Medicine

## 2013-04-20 MED ORDER — ONETOUCH ULTRA SYSTEM W/DEVICE KIT: 1.0000 | PACK | Freq: Once | Status: AC

## 2013-04-20 MED ORDER — ONETOUCH ULTRASOFT LANCETS MISC: Status: AC

## 2013-04-20 MED ORDER — GLUCOSE BLOOD VI STRP: ORAL_STRIP | Status: AC

## 2013-05-09 ENCOUNTER — Other Ambulatory Visit: Payer: Self-pay | Admitting: Family Medicine

## 2013-07-05 ENCOUNTER — Telehealth: Payer: Self-pay | Admitting: Family Medicine

## 2013-07-05 DIAGNOSIS — E119 Type 2 diabetes mellitus without complications: Secondary | ICD-10-CM

## 2013-07-05 MED ORDER — METFORMIN HCL 500 MG PO TABS: ORAL_TABLET | ORAL | Status: DC

## 2013-07-05 NOTE — Telephone Encounter (Signed)
Rx sent to [facility]

## 2013-07-05 NOTE — Telephone Encounter (Signed)
[facility] requesting new script for metFORMIN (GLUCOPHAGE) 500 MG tablet

## 2014-11-25 ENCOUNTER — Other Ambulatory Visit: Payer: Self-pay

## 2014-11-25 ENCOUNTER — Encounter: Payer: Self-pay | Admitting: Family Medicine

## 2015-09-19 DIAGNOSIS — I10 Essential (primary) hypertension: Secondary | ICD-10-CM | POA: Diagnosis not present

## 2015-09-19 DIAGNOSIS — E119 Type 2 diabetes mellitus without complications: Secondary | ICD-10-CM | POA: Diagnosis not present

## 2015-09-19 DIAGNOSIS — E785 Hyperlipidemia, unspecified: Secondary | ICD-10-CM | POA: Diagnosis not present

## 2015-09-30 DIAGNOSIS — Z0000 Encounter for general adult medical examination without abnormal findings: Secondary | ICD-10-CM | POA: Diagnosis not present

## 2015-09-30 DIAGNOSIS — J45909 Unspecified asthma, uncomplicated: Secondary | ICD-10-CM | POA: Diagnosis not present

## 2015-10-01 DIAGNOSIS — D72819 Decreased white blood cell count, unspecified: Secondary | ICD-10-CM | POA: Diagnosis not present

## 2015-10-20 DIAGNOSIS — H40053 Ocular hypertension, bilateral: Secondary | ICD-10-CM | POA: Diagnosis not present

## 2015-10-20 DIAGNOSIS — E119 Type 2 diabetes mellitus without complications: Secondary | ICD-10-CM | POA: Diagnosis not present

## 2017-02-21 ENCOUNTER — Ambulatory Visit (INDEPENDENT_AMBULATORY_CARE_PROVIDER_SITE_OTHER): Payer: Self-pay | Admitting: Emergency Medicine

## 2017-02-21 VITALS — BP 130/85 | HR 124 | Temp 98.5°F | Resp 16 | Wt 231.4 lb

## 2017-02-21 DIAGNOSIS — W57XXXA Bitten or stung by nonvenomous insect and other nonvenomous arthropods, initial encounter: Secondary | ICD-10-CM

## 2017-02-21 MED ORDER — PREDNISONE 10 MG (21) PO TBPK: ORAL_TABLET | ORAL | 0 refills | Status: DC

## 2017-02-21 MED ORDER — TRIAMCINOLONE ACETONIDE 0.1 % EX CREA: 1.0000 "application " | TOPICAL_CREAM | Freq: Two times a day (BID) | CUTANEOUS | 0 refills | Status: DC

## 2017-02-21 MED ORDER — HYDROXYZINE HCL 25 MG PO TABS: 25.0000 mg | ORAL_TABLET | Freq: Three times a day (TID) | ORAL | 0 refills | Status: DC | PRN

## 2017-02-21 NOTE — Patient Instructions (Signed)
Insect Bite, Adult An insect bite can make your skin red, itchy, and swollen. Some insects can spread disease to people with a bite. However, most insect bites do not lead to disease, and most are not serious. Follow these instructions at home: Bite area care  Do not scratch the bite area.  Keep the bite area clean and dry.  Wash the bite area every day with soap and water as told by your doctor.  Check the bite area every day for signs of infection. Check for: ? More redness, swelling, or pain. ? Fluid or blood. ? Warmth. ? Pus. Managing pain, itching, and swelling  You may put any of these on the bite area as told by your doctor: ? A baking soda paste. ? Cortisone cream. ? Calamine lotion.  If directed, put ice on the bite area. ? Put ice in a plastic bag. ? Place a towel between your skin and the bag. ? Leave the ice on for 20 minutes, 2-3 times a day. Medicines  Take medicines or put medicines on your skin only as told by your doctor.  If you were prescribed an antibiotic medicine, use it as told by your doctor. Do not stop using the antibiotic even if your condition improves. General instructions  Keep all follow-up visits as told by your doctor. This is important. How is this prevented? To help you have a lower risk of insect bites:  When you are outside, wear clothing that covers your arms and legs.  Use insect repellent. The best insect repellents have: ? An active ingredient of DEET, picaridin, oil of lemon eucalyptus (OLE), or IR3535. ? Higher amounts of DEET or another active ingredient than other repellents have.  If your home windows do not have screens, think about putting some in.  Contact a doctor if:  You have more redness, swelling, or pain in the bite area.  You have fluid, blood, or pus coming from the bite area.  The bite area feels warm.  You have a fever. Get help right away if:  You have joint pain.  You have a rash.  You have  shortness of breath.  You feel more tired or sleepy than you normally do.  You have neck pain.  You have a headache.  You feel weaker than you normally do.  You have chest pain.  You have pain in your belly.  You feel sick to your stomach (nauseous) or you throw up (vomit). Summary  An insect bite can make your skin red, itchy, and swollen.  Do not scratch the bite area, and keep it clean and dry.  Ice can help with pain and itching from the bite. This information is not intended to replace advice given to you by your health care provider. Make sure you discuss any questions you have with your health care provider. Document Released: 05/14/2000 Document Revised: 12/18/2015 Document Reviewed: 10/02/2014 Elsevier Interactive Patient Education  2018 Elsevier Inc.  
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
## 2017-02-23 ENCOUNTER — Telehealth: Payer: Self-pay | Admitting: Emergency Medicine

## 2017-02-23 NOTE — Telephone Encounter (Signed)
called patient to follow up with him since his visit. Patient stated he is doing very well. I notified him to call us if he needs anything, patient stated he would

## 2017-05-02 DIAGNOSIS — Z13220 Encounter for screening for lipoid disorders: Secondary | ICD-10-CM | POA: Diagnosis not present

## 2017-05-02 DIAGNOSIS — E785 Hyperlipidemia, unspecified: Secondary | ICD-10-CM | POA: Diagnosis not present

## 2017-05-02 DIAGNOSIS — I10 Essential (primary) hypertension: Secondary | ICD-10-CM | POA: Diagnosis not present

## 2017-05-02 DIAGNOSIS — Z23 Encounter for immunization: Secondary | ICD-10-CM | POA: Diagnosis not present

## 2017-05-02 DIAGNOSIS — E119 Type 2 diabetes mellitus without complications: Secondary | ICD-10-CM | POA: Diagnosis not present

## 2017-05-02 DIAGNOSIS — Z6833 Body mass index (BMI) 33.0-33.9, adult: Secondary | ICD-10-CM | POA: Diagnosis not present

## 2017-05-31 DIAGNOSIS — M5134 Other intervertebral disc degeneration, thoracic region: Secondary | ICD-10-CM

## 2017-05-31 DIAGNOSIS — M5124 Other intervertebral disc displacement, thoracic region: Secondary | ICD-10-CM

## 2017-05-31 HISTORY — DX: Other intervertebral disc degeneration, thoracic region: M51.34

## 2017-05-31 HISTORY — DX: Other intervertebral disc displacement, thoracic region: M51.24

## 2017-08-02 DIAGNOSIS — Z131 Encounter for screening for diabetes mellitus: Secondary | ICD-10-CM | POA: Diagnosis not present

## 2017-08-02 DIAGNOSIS — I10 Essential (primary) hypertension: Secondary | ICD-10-CM | POA: Diagnosis not present

## 2017-08-02 DIAGNOSIS — Z13220 Encounter for screening for lipoid disorders: Secondary | ICD-10-CM | POA: Diagnosis not present

## 2017-08-02 DIAGNOSIS — R7989 Other specified abnormal findings of blood chemistry: Secondary | ICD-10-CM | POA: Diagnosis not present

## 2017-08-04 DIAGNOSIS — Z6834 Body mass index (BMI) 34.0-34.9, adult: Secondary | ICD-10-CM | POA: Diagnosis not present

## 2017-08-04 DIAGNOSIS — E119 Type 2 diabetes mellitus without complications: Secondary | ICD-10-CM | POA: Diagnosis not present

## 2017-08-04 DIAGNOSIS — I10 Essential (primary) hypertension: Secondary | ICD-10-CM | POA: Diagnosis not present

## 2017-08-04 DIAGNOSIS — E782 Mixed hyperlipidemia: Secondary | ICD-10-CM | POA: Diagnosis not present

## 2017-11-11 DIAGNOSIS — Z139 Encounter for screening, unspecified: Secondary | ICD-10-CM | POA: Diagnosis not present

## 2017-11-11 DIAGNOSIS — Z125 Encounter for screening for malignant neoplasm of prostate: Secondary | ICD-10-CM | POA: Diagnosis not present

## 2017-11-11 DIAGNOSIS — Z131 Encounter for screening for diabetes mellitus: Secondary | ICD-10-CM | POA: Diagnosis not present

## 2017-11-11 DIAGNOSIS — Z0000 Encounter for general adult medical examination without abnormal findings: Secondary | ICD-10-CM | POA: Diagnosis not present

## 2017-11-11 DIAGNOSIS — Z13220 Encounter for screening for lipoid disorders: Secondary | ICD-10-CM | POA: Diagnosis not present

## 2017-11-11 DIAGNOSIS — Z114 Encounter for screening for human immunodeficiency virus [HIV]: Secondary | ICD-10-CM | POA: Diagnosis not present

## 2017-11-11 DIAGNOSIS — E538 Deficiency of other specified B group vitamins: Secondary | ICD-10-CM | POA: Diagnosis not present

## 2017-11-14 DIAGNOSIS — I10 Essential (primary) hypertension: Secondary | ICD-10-CM | POA: Diagnosis not present

## 2017-11-14 DIAGNOSIS — E119 Type 2 diabetes mellitus without complications: Secondary | ICD-10-CM | POA: Diagnosis not present

## 2017-11-14 DIAGNOSIS — Z6834 Body mass index (BMI) 34.0-34.9, adult: Secondary | ICD-10-CM | POA: Diagnosis not present

## 2017-11-14 DIAGNOSIS — E782 Mixed hyperlipidemia: Secondary | ICD-10-CM | POA: Diagnosis not present

## 2017-11-21 ENCOUNTER — Ambulatory Visit
Admission: RE | Admit: 2017-11-21 | Discharge: 2017-11-21 | Disposition: A | Discharge status: Home / Self Care | Payer: Self-pay | Source: Ambulatory Visit | Attending: Family Medicine | Admitting: Family Medicine

## 2017-11-21 ENCOUNTER — Other Ambulatory Visit: Payer: Self-pay | Admitting: Family Medicine

## 2017-11-21 DIAGNOSIS — M25512 Pain in left shoulder: Secondary | ICD-10-CM

## 2017-11-21 DIAGNOSIS — R52 Pain, unspecified: Secondary | ICD-10-CM

## 2017-11-21 DIAGNOSIS — M25522 Pain in left elbow: Secondary | ICD-10-CM

## 2017-11-21 DIAGNOSIS — M542 Cervicalgia: Secondary | ICD-10-CM

## 2017-11-21 DIAGNOSIS — Z23 Encounter for immunization: Secondary | ICD-10-CM | POA: Diagnosis not present

## 2017-11-21 DIAGNOSIS — Z0000 Encounter for general adult medical examination without abnormal findings: Secondary | ICD-10-CM | POA: Diagnosis not present

## 2017-11-21 DIAGNOSIS — Z6834 Body mass index (BMI) 34.0-34.9, adult: Secondary | ICD-10-CM | POA: Diagnosis not present

## 2017-12-14 DIAGNOSIS — M5412 Radiculopathy, cervical region: Secondary | ICD-10-CM | POA: Diagnosis not present

## 2017-12-20 DIAGNOSIS — M546 Pain in thoracic spine: Secondary | ICD-10-CM | POA: Diagnosis not present

## 2017-12-20 DIAGNOSIS — M5413 Radiculopathy, cervicothoracic region: Secondary | ICD-10-CM | POA: Diagnosis not present

## 2017-12-20 DIAGNOSIS — H5213 Myopia, bilateral: Secondary | ICD-10-CM | POA: Diagnosis not present

## 2017-12-20 DIAGNOSIS — R293 Abnormal posture: Secondary | ICD-10-CM | POA: Diagnosis not present

## 2017-12-20 DIAGNOSIS — H524 Presbyopia: Secondary | ICD-10-CM | POA: Diagnosis not present

## 2017-12-20 DIAGNOSIS — H52203 Unspecified astigmatism, bilateral: Secondary | ICD-10-CM | POA: Diagnosis not present

## 2017-12-20 DIAGNOSIS — E119 Type 2 diabetes mellitus without complications: Secondary | ICD-10-CM | POA: Diagnosis not present

## 2017-12-20 DIAGNOSIS — M542 Cervicalgia: Secondary | ICD-10-CM | POA: Diagnosis not present

## 2017-12-21 DIAGNOSIS — M4802 Spinal stenosis, cervical region: Secondary | ICD-10-CM | POA: Diagnosis not present

## 2017-12-21 DIAGNOSIS — M5412 Radiculopathy, cervical region: Secondary | ICD-10-CM | POA: Diagnosis not present

## 2017-12-22 DIAGNOSIS — R293 Abnormal posture: Secondary | ICD-10-CM | POA: Diagnosis not present

## 2017-12-22 DIAGNOSIS — M546 Pain in thoracic spine: Secondary | ICD-10-CM | POA: Diagnosis not present

## 2017-12-22 DIAGNOSIS — M5413 Radiculopathy, cervicothoracic region: Secondary | ICD-10-CM | POA: Diagnosis not present

## 2017-12-22 DIAGNOSIS — M542 Cervicalgia: Secondary | ICD-10-CM | POA: Diagnosis not present

## 2017-12-30 DIAGNOSIS — M5413 Radiculopathy, cervicothoracic region: Secondary | ICD-10-CM | POA: Diagnosis not present

## 2017-12-30 DIAGNOSIS — M542 Cervicalgia: Secondary | ICD-10-CM | POA: Diagnosis not present

## 2017-12-30 DIAGNOSIS — M546 Pain in thoracic spine: Secondary | ICD-10-CM | POA: Diagnosis not present

## 2017-12-30 DIAGNOSIS — R293 Abnormal posture: Secondary | ICD-10-CM | POA: Diagnosis not present

## 2018-01-02 DIAGNOSIS — I10 Essential (primary) hypertension: Secondary | ICD-10-CM | POA: Diagnosis not present

## 2018-01-02 DIAGNOSIS — M5412 Radiculopathy, cervical region: Secondary | ICD-10-CM | POA: Diagnosis not present

## 2018-01-02 DIAGNOSIS — Z6834 Body mass index (BMI) 34.0-34.9, adult: Secondary | ICD-10-CM | POA: Diagnosis not present

## 2018-01-02 DIAGNOSIS — M542 Cervicalgia: Secondary | ICD-10-CM | POA: Diagnosis not present

## 2018-01-02 DIAGNOSIS — M546 Pain in thoracic spine: Secondary | ICD-10-CM | POA: Diagnosis not present

## 2018-01-02 DIAGNOSIS — R293 Abnormal posture: Secondary | ICD-10-CM | POA: Diagnosis not present

## 2018-01-02 DIAGNOSIS — M5413 Radiculopathy, cervicothoracic region: Secondary | ICD-10-CM | POA: Diagnosis not present

## 2018-01-04 DIAGNOSIS — R293 Abnormal posture: Secondary | ICD-10-CM | POA: Diagnosis not present

## 2018-01-04 DIAGNOSIS — M5413 Radiculopathy, cervicothoracic region: Secondary | ICD-10-CM | POA: Diagnosis not present

## 2018-01-04 DIAGNOSIS — M542 Cervicalgia: Secondary | ICD-10-CM | POA: Diagnosis not present

## 2018-01-04 DIAGNOSIS — M546 Pain in thoracic spine: Secondary | ICD-10-CM | POA: Diagnosis not present

## 2018-01-09 DIAGNOSIS — M546 Pain in thoracic spine: Secondary | ICD-10-CM | POA: Diagnosis not present

## 2018-01-09 DIAGNOSIS — R293 Abnormal posture: Secondary | ICD-10-CM | POA: Diagnosis not present

## 2018-01-09 DIAGNOSIS — M5413 Radiculopathy, cervicothoracic region: Secondary | ICD-10-CM | POA: Diagnosis not present

## 2018-01-09 DIAGNOSIS — M542 Cervicalgia: Secondary | ICD-10-CM | POA: Diagnosis not present

## 2018-01-11 DIAGNOSIS — M5413 Radiculopathy, cervicothoracic region: Secondary | ICD-10-CM | POA: Diagnosis not present

## 2018-01-11 DIAGNOSIS — M542 Cervicalgia: Secondary | ICD-10-CM | POA: Diagnosis not present

## 2018-01-11 DIAGNOSIS — R293 Abnormal posture: Secondary | ICD-10-CM | POA: Diagnosis not present

## 2018-01-11 DIAGNOSIS — M546 Pain in thoracic spine: Secondary | ICD-10-CM | POA: Diagnosis not present

## 2018-01-12 NOTE — Telephone Encounter (Signed)
error 

## 2018-01-16 DIAGNOSIS — M546 Pain in thoracic spine: Secondary | ICD-10-CM | POA: Diagnosis not present

## 2018-01-16 DIAGNOSIS — M5413 Radiculopathy, cervicothoracic region: Secondary | ICD-10-CM | POA: Diagnosis not present

## 2018-01-16 DIAGNOSIS — M542 Cervicalgia: Secondary | ICD-10-CM | POA: Diagnosis not present

## 2018-01-16 DIAGNOSIS — R293 Abnormal posture: Secondary | ICD-10-CM | POA: Diagnosis not present

## 2018-01-23 DIAGNOSIS — R293 Abnormal posture: Secondary | ICD-10-CM | POA: Diagnosis not present

## 2018-01-23 DIAGNOSIS — M5413 Radiculopathy, cervicothoracic region: Secondary | ICD-10-CM | POA: Diagnosis not present

## 2018-01-23 DIAGNOSIS — M546 Pain in thoracic spine: Secondary | ICD-10-CM | POA: Diagnosis not present

## 2018-01-23 DIAGNOSIS — M542 Cervicalgia: Secondary | ICD-10-CM | POA: Diagnosis not present

## 2018-02-27 DIAGNOSIS — E785 Hyperlipidemia, unspecified: Secondary | ICD-10-CM | POA: Diagnosis not present

## 2018-02-27 DIAGNOSIS — E119 Type 2 diabetes mellitus without complications: Secondary | ICD-10-CM | POA: Diagnosis not present

## 2018-02-27 DIAGNOSIS — I10 Essential (primary) hypertension: Secondary | ICD-10-CM | POA: Diagnosis not present

## 2018-02-27 DIAGNOSIS — E538 Deficiency of other specified B group vitamins: Secondary | ICD-10-CM | POA: Diagnosis not present

## 2018-03-02 DIAGNOSIS — Z6834 Body mass index (BMI) 34.0-34.9, adult: Secondary | ICD-10-CM | POA: Diagnosis not present

## 2018-03-02 DIAGNOSIS — E782 Mixed hyperlipidemia: Secondary | ICD-10-CM | POA: Diagnosis not present

## 2018-03-02 DIAGNOSIS — E119 Type 2 diabetes mellitus without complications: Secondary | ICD-10-CM | POA: Diagnosis not present

## 2018-03-02 DIAGNOSIS — Z23 Encounter for immunization: Secondary | ICD-10-CM | POA: Diagnosis not present

## 2018-04-14 DIAGNOSIS — Z6834 Body mass index (BMI) 34.0-34.9, adult: Secondary | ICD-10-CM | POA: Diagnosis not present

## 2018-04-14 DIAGNOSIS — I10 Essential (primary) hypertension: Secondary | ICD-10-CM | POA: Diagnosis not present

## 2018-06-01 DIAGNOSIS — I10 Essential (primary) hypertension: Secondary | ICD-10-CM | POA: Diagnosis not present

## 2018-06-01 DIAGNOSIS — E785 Hyperlipidemia, unspecified: Secondary | ICD-10-CM | POA: Diagnosis not present

## 2018-06-01 DIAGNOSIS — E119 Type 2 diabetes mellitus without complications: Secondary | ICD-10-CM | POA: Diagnosis not present

## 2018-06-01 DIAGNOSIS — E875 Hyperkalemia: Secondary | ICD-10-CM | POA: Diagnosis not present

## 2018-06-05 DIAGNOSIS — E782 Mixed hyperlipidemia: Secondary | ICD-10-CM | POA: Diagnosis not present

## 2018-06-05 DIAGNOSIS — Z6834 Body mass index (BMI) 34.0-34.9, adult: Secondary | ICD-10-CM | POA: Diagnosis not present

## 2018-06-05 DIAGNOSIS — I10 Essential (primary) hypertension: Secondary | ICD-10-CM | POA: Diagnosis not present

## 2018-06-05 DIAGNOSIS — E119 Type 2 diabetes mellitus without complications: Secondary | ICD-10-CM | POA: Diagnosis not present

## 2018-07-10 DIAGNOSIS — N289 Disorder of kidney and ureter, unspecified: Secondary | ICD-10-CM | POA: Diagnosis not present

## 2018-07-11 ENCOUNTER — Ambulatory Visit (INDEPENDENT_AMBULATORY_CARE_PROVIDER_SITE_OTHER): Payer: BLUE CROSS/BLUE SHIELD | Admitting: Neurology

## 2018-07-11 ENCOUNTER — Encounter: Payer: Self-pay | Admitting: Neurology

## 2018-07-11 VITALS — BP 152/101 | HR 111 | Ht 68.0 in | Wt 229.0 lb

## 2018-07-11 DIAGNOSIS — Z9189 Other specified personal risk factors, not elsewhere classified: Secondary | ICD-10-CM | POA: Diagnosis not present

## 2018-07-11 DIAGNOSIS — R0683 Snoring: Secondary | ICD-10-CM

## 2018-07-11 DIAGNOSIS — E669 Obesity, unspecified: Secondary | ICD-10-CM

## 2018-07-11 DIAGNOSIS — R0681 Apnea, not elsewhere classified: Secondary | ICD-10-CM

## 2018-07-11 NOTE — Progress Notes (Signed)
Subjective:    Patient ID: Edwin Nelson is a 48 y.o. male.  HPI     Star Age, MD, PhD [facility] [address]O. [place], [place] [address]  Dear Edwin Nelson,   I saw your patient, Edwin Nelson, upon your kind request in my sleep clinic today for initial consultation of his sleep disorder, in particular, concern for underlying obstructive sleep apnea. The patient is unaccompanied today. As you know, Edwin Nelson is a 49 year old right-handed gentleman with an underlying medical history of allergies, diabetes, hypertension, asthma, and obesity, who reports snoring and witnessed breathing pauses while asleep, per wife's report. I reviewed your office note from 06/05/2018 when he saw Edwin Nelson, [place]. His Epworth sleepiness score is 3 out of 24 today, fatigue score is 17 out of 63. He lives with his spouse, they have no children. He works from home. He quit smoking in 2008, drinks alcohol about 4 nights out of the week, 4-5 drinks, caffeine in the form of coffee, one cup in the morning typically. He works for [employer] and has to travel once a month typically. His bedtime generally speaking is around 11 PM and rise time between 7 and 7:30 AM. He denies night to night nocturia or recurrent morning headaches. He is not aware of any family history of OSA. He had a tonsillectomy and am entry school. They have no pets in the household. He does not watch TV in the bedroom. He has had some increase in blood pressure values and has been tracking his blood pressure at home. He had an increase in his lisinopril with time. He has woken up with a sense of gasping rarely.  His Past Medical History Is Significant For: Past Medical History:  Diagnosis Date  . Diabetes mellitus without complication ([place])   . Hyperlipidemia   . Hypertension     His Past Surgical History Is Significant For: No past surgical history on file.  His Family History Is Significant  For: Family History  Problem Relation Age of Onset  . Diabetes Father   . Heart disease Father   . Stroke Father     His Social History Is Significant For: Social History   Socioeconomic History  . Marital status: Single    Spouse name: Not on file  . Number of children: Not on file  . Years of education: Not on file  . Highest education level: Not on file  Occupational History  . Not on file  Social Needs  . Financial resource strain: Not on file  . Food insecurity:    Worry: Not on file    Inability: Not on file  . Transportation needs:    Medical: Not on file    Non-medical: Not on file  Tobacco Use  . Smoking status: Former Research scientist (life sciences)  . Smokeless tobacco: Never Used  Substance and Sexual Activity  . Alcohol use: Yes  . Drug use: No  . Sexual activity: Not on file  Lifestyle  . Physical activity:    Days per week: Not on file    Minutes per session: Not on file  . Stress: Not on file  Relationships  . Social connections:    Talks on phone: Not on file    Gets together: Not on file    Attends religious service: Not on file    Active member of club or organization: Not on file    Attends meetings of clubs or organizations: Not on file  Relationship status: Not on file  Other Topics Concern  . Not on file  Social History Narrative  . Not on file    His Allergies Are:  No Known Allergies:   His Current Medications Are:  Outpatient Encounter Medications as of 07/11/2018  Medication Sig  . Blood Glucose Monitoring Suppl (ONE TOUCH ULTRA SYSTEM KIT) W/DEVICE KIT 1 kit by Does not apply route once.  . fluticasone (FLONASE) 50 MCG/ACT nasal spray USE 1 SPRAY NASALLY TWICE A DAY  . glucose blood test strip Use up to twice daily, dx 250.00  . Icosapent Ethyl (VASCEPA PO) Take by mouth.  . Lancets (ONETOUCH ULTRASOFT) lancets Use up to twice daily, dx 250.00  . lisinopril (PRINIVIL,ZESTRIL) 30 MG tablet Take 30 mg by mouth daily.  . metFORMIN (GLUCOPHAGE) 500 MG  tablet One half tablet prior to breakfast  . PROVENTIL HFA 108 (90 BASE) MCG/ACT inhaler INHALE 2 PUFFS THREE TIMES A DAY AS NEEDED  . QVAR 80 MCG/ACT inhaler INHALE 1 PUFF INTO THE LUNGS TWICE A DAY  . rosuvastatin (CRESTOR) 10 MG tablet Take 10 mg by mouth daily.  . [DISCONTINUED] CRESTOR 20 MG tablet TAKE 1 TABLET DAILY (Patient not taking: Reported on 02/21/2017)  . [DISCONTINUED] hydrOXYzine (ATARAX/VISTARIL) 25 MG tablet Take 1 tablet (25 mg total) by mouth 3 (three) times daily as needed.  . [DISCONTINUED] lisinopril (PRINIVIL,ZESTRIL) 10 MG tablet TAKE 1 TABLET DAILY  . [DISCONTINUED] predniSONE (STERAPRED UNI-PAK 21 TAB) 10 MG (21) TBPK tablet Take 6 tablets today, then decrease by one tablet daily (6,5,4,3,2,1)  . [DISCONTINUED] triamcinolone cream (KENALOG) 0.1 % Apply 1 application topically 2 (two) times daily. Apply to the affected area twice daily.   No facility-administered encounter medications on file as of 07/11/2018.   :  Review of Systems:  Out of a complete 14 point review of systems, all are reviewed and negative with the exception of these symptoms as listed below: Review of Systems  Neurological:       Pt presents today to discuss his sleep. Pt has never had a sleep study but does endorse snoring.  Epworth Sleepiness Scale 0= would never doze 1= slight chance of dozing 2= moderate chance of dozing 3= high chance of dozing  Sitting and reading: 1 Watching TV: 1 Sitting inactive in a public place (ex. Theater or meeting): 0 As a passenger in a car for an hour without a break: 0 Lying down to rest in the afternoon: 1 Sitting and talking to someone: 0 Sitting quietly after lunch (no alcohol): 0 In a car, while stopped in traffic: 0 Total: 3     Objective:  Neurological Exam  Physical Exam Physical Examination:   Vitals:   07/11/18 1538  BP: (!) 152/101  Pulse: (!) 111   General Examination: The patient is a very pleasant 48 y.o. male in no acute  distress. He appears well-developed and well-nourished and well groomed.   HEENT: Normocephalic, atraumatic, pupils are equal, round and reactive to light and accommodation. Corrective eyeglasses in place. Extraocular tracking is good without limitation to gaze excursion or nystagmus noted. Normal smooth pursuit is noted. Hearing is grossly intact. Face is symmetric with normal facial animation and normal facial sensation. Speech is clear with no dysarthria noted. There is no hypophonia. There is no lip, neck/head, jaw or voice tremor. Neck is supple with full range of passive and active motion. There are no carotid bruits on auscultation. Oropharynx exam reveals: mild mouth dryness, good dental hygiene  and moderate airway crowding, due to smaller airway entry and redundant soft palate. Mallampati is class III. Tongue protrudes centrally and palate elevates symmetrically. Tonsils absent. Neck size is 18 3/8 inches. He has a minimal overbite. Nasal inspection reveals no significant nasal mucosal bogginess or redness and no septal deviation.   Chest: Clear to auscultation without wheezing, rhonchi or crackles noted.  Heart: S1+S2+0, regular and normal without murmurs, rubs or gallops noted.   Abdomen: Soft, non-tender and non-distended with normal bowel sounds appreciated on auscultation.  Extremities: There is no pitting edema in the distal lower extremities bilaterally. Pedal pulses are intact.  Skin: Warm and dry without trophic changes noted.  Musculoskeletal: exam reveals no obvious joint deformities, tenderness or joint swelling or erythema.   Neurologically:  Mental status: The patient is awake, alert and oriented in all 4 spheres. His immediate and remote memory, attention, language skills and fund of knowledge are appropriate. There is no evidence of aphasia, agnosia, apraxia or anomia. Speech is clear with normal prosody and enunciation. Thought process is linear. Mood is normal and affect  is normal.  Cranial nerves II - XII are as described above under HEENT exam.  Motor exam: Normal bulk, strength and tone is noted. There is no tremor. Romberg is negative. Fine motor skills and coordination: grossly intact.  Cerebellar testing: No dysmetria or intention tremor. There is no truncal or gait ataxia.  Sensory exam: intact to light touch.   Gait, station and balance: He stands easily. No veering to one side is noted. No leaning to one side is noted. Posture is age-appropriate and stance is narrow based. Gait shows normal stride length and normal pace. No problems turning are noted. Tandem walk is unremarkable.               Assessment and Plan:  In summary, Leeman A Cooprider is a very pleasant 48 y.o.-year old male with an underlying medical history of allergies, diabetes, hypertension, asthma, and obesity, whose history and physical exam are concerning for obstructive sleep apnea (OSA). I had a long chat with the patient about my findings and the diagnosis of OSA, its prognosis and treatment options. We talked about medical treatments, surgical interventions and non-pharmacological approaches. I explained in particular the risks and ramifications of untreated moderate to severe OSA, especially with respect to developing cardiovascular disease down the Road, including congestive heart failure, difficult to treat hypertension, cardiac arrhythmias, or stroke. Even type 2 diabetes has, in part, been linked to untreated OSA. Symptoms of untreated OSA include daytime sleepiness, memory problems, mood irritability and mood disorder such as depression and anxiety, lack of energy, as well as recurrent headaches, especially morning headaches. We talked about trying to maintain a healthy lifestyle in general, as well as the importance of weight control. I encouraged the patient to eat healthy, exercise daily and keep well hydrated, to keep a scheduled bedtime and wake time routine, to not skip any meals and  eat healthy snacks in between meals. I advised the patient not to drive when feeling sleepy. I recommended the following at this time: sleep study with potential positive airway pressure titration. (We will score hypopneas at 3%).   I explained the sleep test procedure to the patient and also outlined possible surgical and non-surgical treatment options of OSA, including the use of a custom-made dental device (which would require a referral to a specialist dentist or oral surgeon), upper airway surgical options, such as pillar implants, radiofrequency surgery, tongue base  surgery, and UPPP (which would involve a referral to an ENT surgeon), as well as the implantable device, Inspire.   I also explained the CPAP treatment option to the patient, who indicated that he would be willing to try CPAP if the need arises. I explained the importance of being compliant with PAP treatment, not only for insurance purposes but primarily to improve His symptoms, and for the patient's long term health benefit, including to reduce His cardiovascular risks. I answered all his questions today and the patient was in agreement. I plan to see him back after the sleep study is completed and encouraged him to call with any interim questions, concerns, problems or updates.   Thank you very much for allowing me to participate in the care of this nice patient. If I can be of any further assistance to you please do not hesitate to call me at [phone].  Sincerely,   Star Age, MD, PhD

## 2018-07-11 NOTE — Patient Instructions (Signed)

## 2018-08-24 DIAGNOSIS — I10 Essential (primary) hypertension: Secondary | ICD-10-CM | POA: Diagnosis not present

## 2018-08-24 DIAGNOSIS — Z719 Counseling, unspecified: Secondary | ICD-10-CM | POA: Diagnosis not present

## 2018-08-24 DIAGNOSIS — E119 Type 2 diabetes mellitus without complications: Secondary | ICD-10-CM | POA: Diagnosis not present

## 2018-09-04 ENCOUNTER — Other Ambulatory Visit: Payer: Self-pay

## 2018-09-04 ENCOUNTER — Ambulatory Visit (INDEPENDENT_AMBULATORY_CARE_PROVIDER_SITE_OTHER): Payer: BLUE CROSS/BLUE SHIELD | Admitting: Neurology

## 2018-09-04 DIAGNOSIS — Z9189 Other specified personal risk factors, not elsewhere classified: Secondary | ICD-10-CM

## 2018-09-04 DIAGNOSIS — G4733 Obstructive sleep apnea (adult) (pediatric): Secondary | ICD-10-CM | POA: Diagnosis not present

## 2018-09-04 DIAGNOSIS — R0681 Apnea, not elsewhere classified: Secondary | ICD-10-CM

## 2018-09-04 DIAGNOSIS — R0683 Snoring: Secondary | ICD-10-CM

## 2018-09-04 DIAGNOSIS — G4734 Idiopathic sleep related nonobstructive alveolar hypoventilation: Secondary | ICD-10-CM

## 2018-09-04 DIAGNOSIS — E669 Obesity, unspecified: Secondary | ICD-10-CM

## 2018-09-18 NOTE — Progress Notes (Signed)
Patient referred by Dr. Ernie Hew, seen by me on 07/11/18, HST on 09/06/18.    Please call and notify the patient that the recent home sleep test showed obstructive sleep apnea in the severe range. While I recommend treatment for this in the form CPAP, we are not yet bringing patients in for in-lab testing for CPAP titration studies, due to the virus pandemic; therefore, I suggest we start him on a trial of autoPAP at home, which means, that we don't have to bring him in for a sleep study with CPAP, but will let him start using an autoPAP machine at home, through a DME company (of patient's choice, or as per insurance requirement, as per in [employer], if there are such restrictions, depending on insurance carrier). The DME representative will educate the patient on how to use the machine, how to put the mask on, etc. I have placed an order in the chart. Please send referral, talk to patient, send report to referring MD. We will need a FU in sleep clinic for 10 weeks post-PAP set up, please arrange that with me or one of our NPs.  Also, please remind patient about the importance of compliance with PAP usage, even if he wants to "try" it for 3 months or so; this is an Designer, industrial/product, but good compliance also helps [place] track improvements in patient's sleep related complaints and objective improvements, such as BP and weight for example or nocturia or headaches, etc.  Please remind him, that severe OSA is best treated with a positive airway pressure device, but sometimes with have to change to other modalities within the PAP options, such as autoPAP and CPAP and BiPAP and rarely more sophisticated machines.  For concerns and questions about how to clean the PAP machine and the supplies and how frequently to change the hose, mask and filters, etc., patient can call the DME company, for more information, education and troubleshooting. Especially in the current situation, I recommend, patients be extra mindful  about hand hygiene, handling the PAP equipment only with clean hands, wipe the mask daily, keep little one and four-legged companions (and any other pets for that matter) away from the machine and mask at all times.   I will also order an ONO for 1 week post set up. This is to monitor O2 sats for one night while he is on autoPAP and comfortable using is to some degree, his O2 sats dropped significantly during the HST.  Thanks,   Star Age, MD, PhD [facility])

## 2018-09-18 NOTE — Addendum Note (Signed)
Addended by: Star Age on: 09/18/2018 03:39 PM   Modules accepted: Orders

## 2018-09-18 NOTE — Procedures (Signed)
Patient Information     First Name: Edwin Last Name: Nelson ID: [id number]  Birth Date: July 15, 1970 Age: 48 Gender: Male    BMI: 34.7 (W=229 lb, H=5' 8'')  Neck Circ.: : 18 '' Epworth:  3   Sleep Study Information    Study Date: Sep 06, 2018 S/H/A Version: [id number] / 4.1.1531 / 57  History: 48 year old man with a history of allergies, diabetes, hypertension, asthma, and obesity, who reports snoring and witnessed breathing pauses while asleep, per wifes report.  Diagnosis:     OSA, Nocturnal Hypoxemia Recommendations:     This home sleep test demonstrates severe obstructive sleep apnea and severe desaturations with a total AHI of 31.2/hour and O2 nadir in the 50s (technical data suggest nadir of 51%) with significant time below or at 88% saturation, in keeping with nocturnal hypoxemia. Treatment with positive airway pressure - in the form of CPAP - is recommended. This will require, ideally, a full night CPAP titration study for proper treatment settings, O2 monitoring and mask fitting. Based on the severity of the sleep disordered breathing, an attended titration study is indicated. However, under the current circumstances (i.e. the COVID-19 pandemic), in order to ensure ongoing good care and for the safety of the patient, she will be advised to proceed with an autoPAP titration/trial at home. A proper titration study with CPAP may be helpful or needed down the road, when considered safe. An overnight pulse oximetry test will likely help monitor oxygen saturation levels, once he is established on autoPAP. Please note, that untreated obstructive sleep apnea may carry additional perioperative morbidity. Patients with significant obstructive sleep apnea should receive perioperative PAP therapy and the surgeons and particularly the anesthesiologist should be informed of the diagnosis and the severity of the sleep disordered breathing. Patient will be reminded regarding compliance with the PAP machine and to  be mindful of cleanliness with the equipment and timely with supply changes (i.e. changing filter, mask, hose, humidifier chamber on an ongoing basis as recommended, and cleaning parts that touch the face and nose daily, etc). The patient should be cautioned not to drive, work at heights, or operate dangerous or heavy equipment when tired or sleepy. Review and reiteration of good sleep hygiene measures should be pursued with any patient. Other causes of the patient's symptoms, including circadian rhythm disturbances, an underlying mood disorder, medication effect and/or an underlying medical problem cannot be ruled out based on this test. Clinical correlation is recommended. The patient and his referring provider, Dr. Ernie Hew will be notified of the test results. The patient will be seen in follow up in sleep clinic at [facility], either for a face-to-face or virtual visit, whichever feasible and recommended at the time.  I certify that I have reviewed the raw data recording prior to the issuance of this report in accordance with the standards of the American Academy of Sleep Medicine (AASM).  Star Age, MD, PhD [facility]) Diplomat, ABPN (Neurology and Sleep)                  Sleep Summary  Oxygen Saturation Statistics   Start Study Time: End Study Time: Total Recording Time: 11:26:08PM 7:24:01 AM 7 hrs, 57 min  Total Sleep Time Inconclusive REM Detection 7 hrs, 19 min    Mean: 89 Minimum: 51 Maximum: 100  Mean of Desaturations Nadirs (%):   76  Oxygen Desatur. %:   4-9 10-20 >20 Total  Events Number Total    67  117 33.5 58.5  16 8.0  200 100.0  Oxygen Saturation: <90 <=88 <85 <80 <70  Duration (minutes): Sleep % 123.7 28.1 117.8 100.8 26.8 22.9 85.0 19.3 47.2 10.7     Respiratory Indices      Total Events REM NREM All Night  pRDI:  226  pAHI:  220 ODI:  200  pAHIc:  59  % CSR: 0.0 N/A N/A N/A N/A N/A N/A N/A N/A 32.1 31.2 28.4 8.4        Pulse Rate Statistics during Sleep (BPM)      Mean: 80 Minimum: 64 Maximum: 100    Indices are calculated using technically valid sleep time of  7 hrs, 2 min. pRDI/pAHI are calculated using oxi desaturations ? 3% Sit N/A  Wake  REM Body Position Statistics  Position Supine Prone Right Left Non-Supine  Sleep (min) 215.9 0.0 150.0 74.0 224.0  Sleep % 49.1 0.0 34.1 16.8 50.9  pRDI 43.4 N/A 14.8 36.6 22.0  pAHI 41.6 N/A 14.8 36.6 22.0  ODI 39.4 N/A 12.0 31.7 18.5     Snoring Statistics Snoring Level (dB) >40 >50 >60 >70 >80 >Threshold (45)  Sleep (min) 216.7 99.3 9.2 0.0 0.0 147.9  Sleep % 49.3 22.6 2.1 0.0 0.0 33.6    Mean: 45 dB Sleep Stages Chart                                                pAHI=31.2                                                             Mild              Moderate                    Severe                                                    5              15                    30  * Reference values are according to AASM guidelines

## 2018-09-19 ENCOUNTER — Telehealth: Payer: Self-pay

## 2018-09-19 DIAGNOSIS — G4733 Obstructive sleep apnea (adult) (pediatric): Secondary | ICD-10-CM

## 2018-09-19 DIAGNOSIS — Z9989 Dependence on other enabling machines and devices: Secondary | ICD-10-CM

## 2018-09-19 DIAGNOSIS — G4734 Idiopathic sleep related nonobstructive alveolar hypoventilation: Secondary | ICD-10-CM

## 2018-09-19 NOTE — Telephone Encounter (Signed)
I called pt. I advised pt that Dr. Rexene Alberts reviewed their sleep study results and found that pt has severe osa. Dr. Rexene Alberts recommends that pt start an auto pap at home since we are unable to do cpap titration studies right now because of the pandemic. I reviewed PAP compliance expectations with the pt. Pt is agreeable to starting an auto-PAP. I advised pt that an order will be sent to a DME, [facility], and [facility] will call the pt within about one week after they file with the pt's insurance. [facility] will show the pt how to use the machine, fit for masks, and troubleshoot the auto-PAP if needed. I advised pt that he will also need to do an ONO on auto pap about one week after getting set up on the auto pap. A follow up appt was made for insurance purposes with Dr. Rexene Alberts on 12/19/2018 at 11:30am. Pt verbalized understanding to arrive 15 minutes early and bring their auto-PAP. A letter with all of this information in it will be mailed to the pt as a reminder. I verified with the pt that the address we have on file is correct. Pt verbalized understanding of results. Pt had no questions at this time but was encouraged to call back if questions arise. I have sent the order to [facility] and have received confirmation that they have received the order.

## 2018-09-19 NOTE — Telephone Encounter (Signed)
I called pt to discuss his sleep study results. No answer, left a message asking him to call me back. 

## 2018-09-19 NOTE — Telephone Encounter (Signed)
-----   Message from Star Age, MD sent at 09/18/2018  3:39 PM EDT ----- Patient referred by Dr. Ernie Hew, seen by me on 07/11/18, HST on 09/06/18.    Please call and notify the patient that the recent home sleep test showed obstructive sleep apnea in the severe range. While I recommend treatment for this in the form CPAP, we are not yet bringing patients in for in-lab testing for CPAP titration studies, due to the virus pandemic; therefore, I suggest we start him on a trial of autoPAP at home, which means, that we don't have to bring him in for a sleep study with CPAP, but will let him start using an autoPAP machine at home, through a DME company (of patient's choice, or as per insurance requirement, as per in [employer], if there are such restrictions, depending on insurance carrier). The DME representative will educate the patient on how to use the machine, how to put the mask on, etc. I have placed an order in the chart. Please send referral, talk to patient, send report to referring MD. We will need a FU in sleep clinic for 10 weeks post-PAP set up, please arrange that with me or one of our NPs.  Also, please remind patient about the importance of compliance with PAP usage, even if he wants to "try" it for 3 months or so; this is an Designer, industrial/product, but good compliance also helps [place] track improvements in patient's sleep related complaints and objective improvements, such as BP and weight for example or nocturia or headaches, etc.  Please remind him, that severe OSA is best treated with a positive airway pressure device, but sometimes with have to change to other modalities within the PAP options, such as autoPAP and CPAP and BiPAP and rarely more sophisticated machines.  For concerns and questions about how to clean the PAP machine and the supplies and how frequently to change the hose, mask and filters, etc., patient can call the DME company, for more information, education and troubleshooting.  Especially in the current situation, I recommend, patients be extra mindful about hand hygiene, handling the PAP equipment only with clean hands, wipe the mask daily, keep little one and four-legged companions (and any other pets for that matter) away from the machine and mask at all times.   I will also order an ONO for 1 week post set up. This is to monitor O2 sats for one night while he is on autoPAP and comfortable using is to some degree, his O2 sats dropped significantly during the HST.  Thanks,   Star Age, MD, PhD [facility])

## 2018-10-02 DIAGNOSIS — G4733 Obstructive sleep apnea (adult) (pediatric): Secondary | ICD-10-CM | POA: Diagnosis not present

## 2018-10-05 DIAGNOSIS — E785 Hyperlipidemia, unspecified: Secondary | ICD-10-CM | POA: Diagnosis not present

## 2018-10-05 DIAGNOSIS — E119 Type 2 diabetes mellitus without complications: Secondary | ICD-10-CM | POA: Diagnosis not present

## 2018-10-05 DIAGNOSIS — I10 Essential (primary) hypertension: Secondary | ICD-10-CM | POA: Diagnosis not present

## 2018-10-10 DIAGNOSIS — G4733 Obstructive sleep apnea (adult) (pediatric): Secondary | ICD-10-CM | POA: Diagnosis not present

## 2018-10-10 DIAGNOSIS — E119 Type 2 diabetes mellitus without complications: Secondary | ICD-10-CM | POA: Diagnosis not present

## 2018-10-10 DIAGNOSIS — E782 Mixed hyperlipidemia: Secondary | ICD-10-CM | POA: Diagnosis not present

## 2018-10-10 DIAGNOSIS — I10 Essential (primary) hypertension: Secondary | ICD-10-CM | POA: Diagnosis not present

## 2018-10-12 ENCOUNTER — Encounter: Payer: Self-pay | Admitting: Neurology

## 2018-10-16 NOTE — Telephone Encounter (Signed)
Received ONO on auto pap results. Test date 10/11/18-10/12/18.

## 2018-10-18 NOTE — Addendum Note (Signed)
Addended by: Star Age on: 10/18/2018 04:17 PM   Modules accepted: Orders

## 2018-10-18 NOTE — Telephone Encounter (Signed)
I called pt to discuss his ONO results. No answer, left a message asking him to call me back. 

## 2018-10-18 NOTE — Telephone Encounter (Signed)
  I reviewed patient's Overnight pulse oximetry test results from 10/11/2018: Patient was using his AutoPap at the time, total test time was 8 hours and 37 minutes, average oxygen saturation was 94%, nadir was 59%.  He had significant periodic/clusters of desaturations, time below or at 88% saturation was 38 minutes for the night.  Please call patient and advise him that despite using AutoPap regularly his oxygen saturations are not optimized.  Ideally, this would mean that we would like to bring him in for a proper titration study in the sleep lab, since we are not quite open for CPAP titration studies in the sleep lab I would like to go ahead and prescribe additional oxygen for him to use at night to be connected with the AutoPap machine, we will send the order to his DME company and they will reach out to him also as far as how to hook up the oxygen to the AutoPap machine.  When it is safe to bring patients in for proper titration studies, we should try to get him in for a sleep study with proper titration and see if we can optimize his treatment settings.  Please remind him to be fully compliant with his AutoPap machine.

## 2018-10-19 NOTE — Telephone Encounter (Signed)
I called pt and discussed his ONO results. Pt is agreeable to adding O2 to his auto pap and coming in for a titration study when it is safe. Order for O2 sent to [facility] via community message. Confirmation received that the order transmitted was successful. Pt verbalized understanding of results and recommendations.

## 2018-10-20 DIAGNOSIS — G4733 Obstructive sleep apnea (adult) (pediatric): Secondary | ICD-10-CM | POA: Diagnosis not present

## 2018-11-01 DIAGNOSIS — R509 Fever, unspecified: Secondary | ICD-10-CM | POA: Diagnosis not present

## 2018-11-01 DIAGNOSIS — Z20828 Contact with and (suspected) exposure to other viral communicable diseases: Secondary | ICD-10-CM | POA: Diagnosis not present

## 2018-11-01 DIAGNOSIS — M791 Myalgia, unspecified site: Secondary | ICD-10-CM | POA: Diagnosis not present

## 2018-11-02 DIAGNOSIS — G4733 Obstructive sleep apnea (adult) (pediatric): Secondary | ICD-10-CM | POA: Diagnosis not present

## 2018-11-02 DIAGNOSIS — Z20828 Contact with and (suspected) exposure to other viral communicable diseases: Secondary | ICD-10-CM | POA: Diagnosis not present

## 2018-11-03 DIAGNOSIS — Z20828 Contact with and (suspected) exposure to other viral communicable diseases: Secondary | ICD-10-CM | POA: Diagnosis not present

## 2018-11-03 DIAGNOSIS — R509 Fever, unspecified: Secondary | ICD-10-CM | POA: Diagnosis not present

## 2018-11-03 DIAGNOSIS — M791 Myalgia, unspecified site: Secondary | ICD-10-CM | POA: Diagnosis not present

## 2018-11-13 IMAGING — CR DG CERVICAL SPINE COMPLETE 4+V
6 series · 6 of 6 positions shown · non-contrast
Comparison: None in PACs

CLINICAL DATA: Left-sided neck pain radiating to the left arm to
the level of the elbow for the past year.

EXAM:
CERVICAL SPINE - COMPLETE 4+ VIEW

[w cervical spine lat]
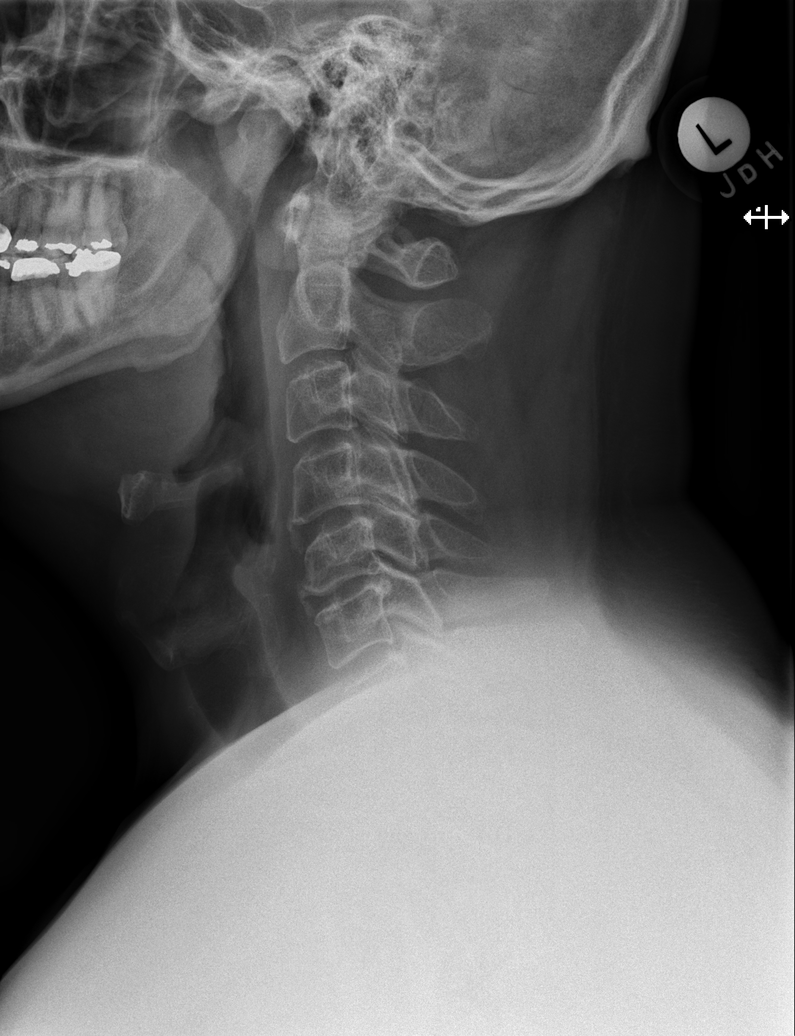

[w cervical spine ap_obl (1 of 2)]
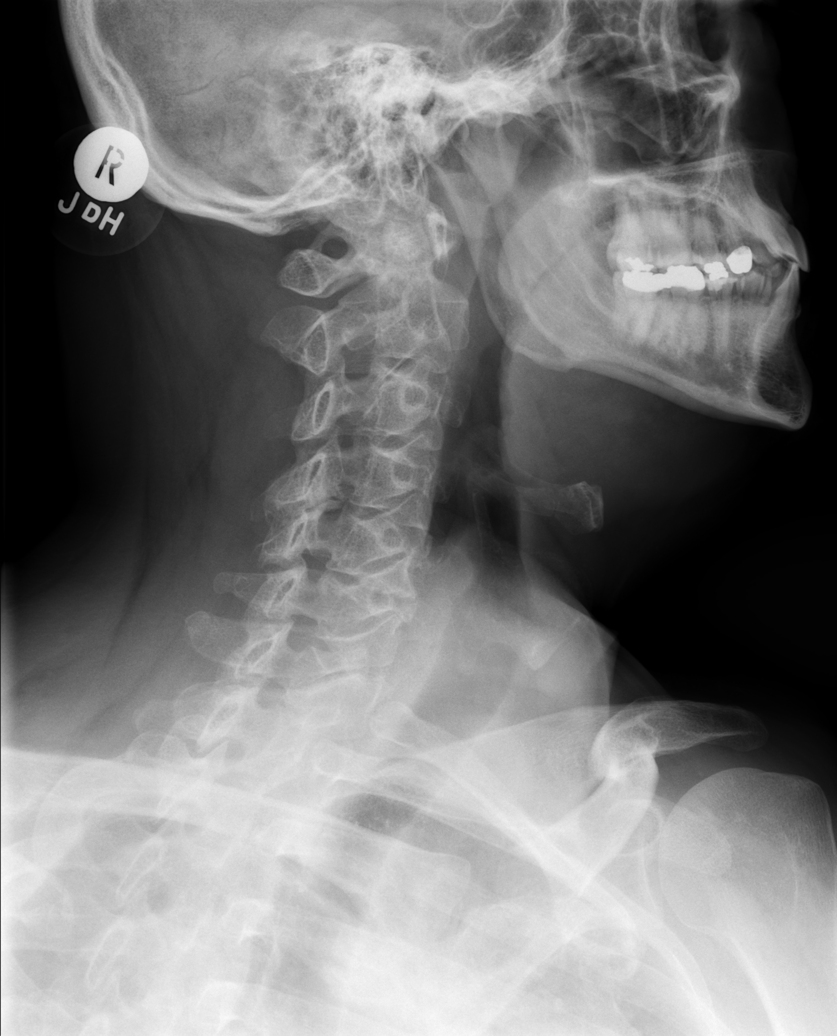

[w cervical spine ap_obl (2 of 2)]
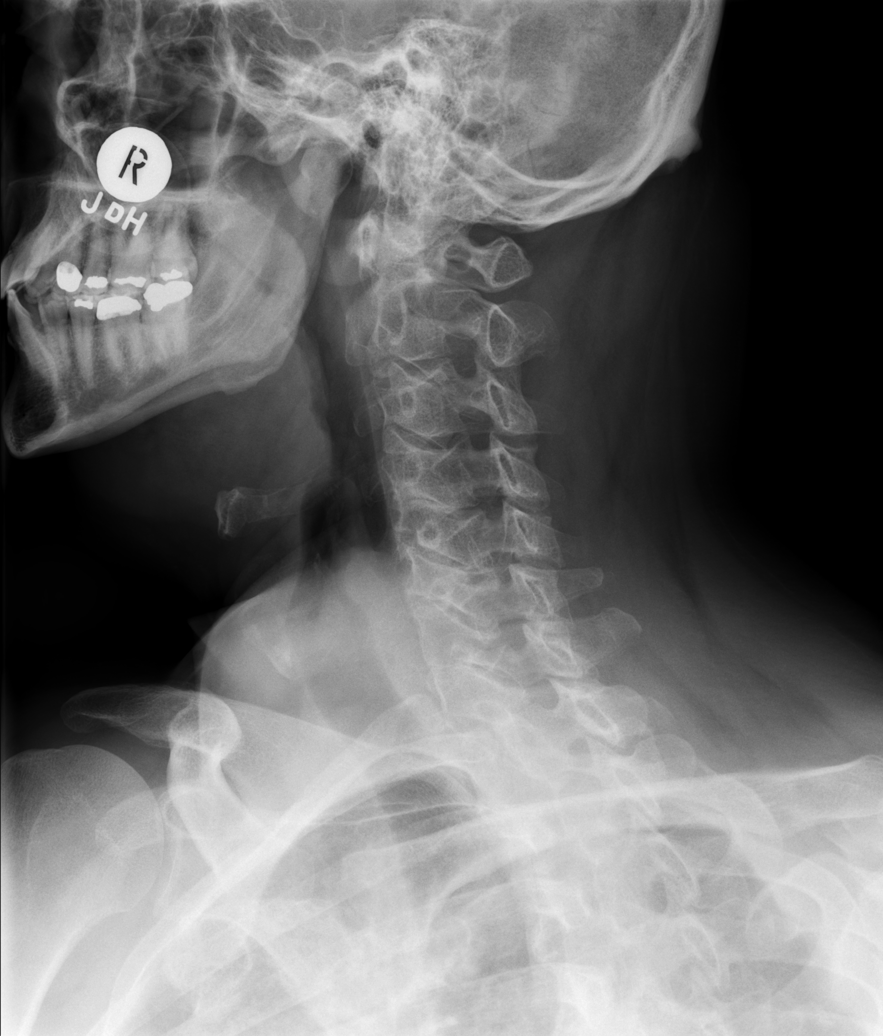

[w cervical spine ap]
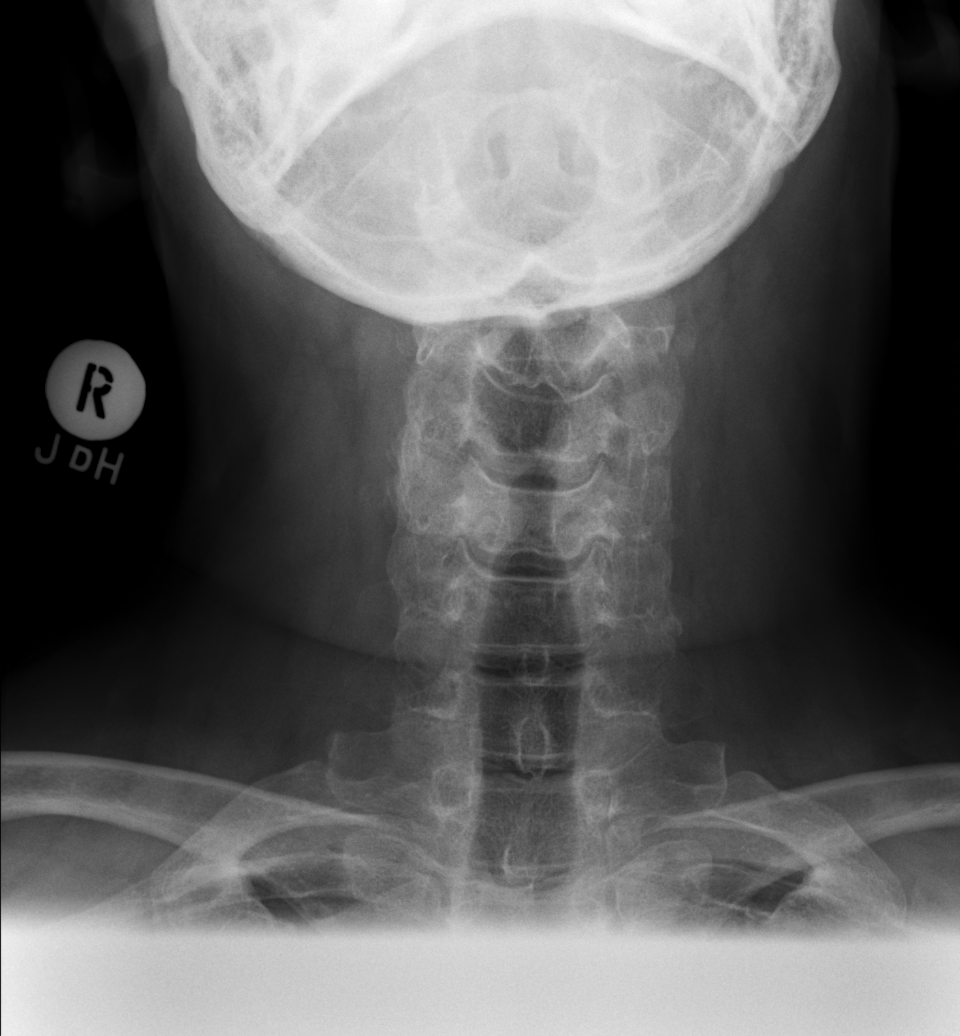

[w cervical spine odontoid]
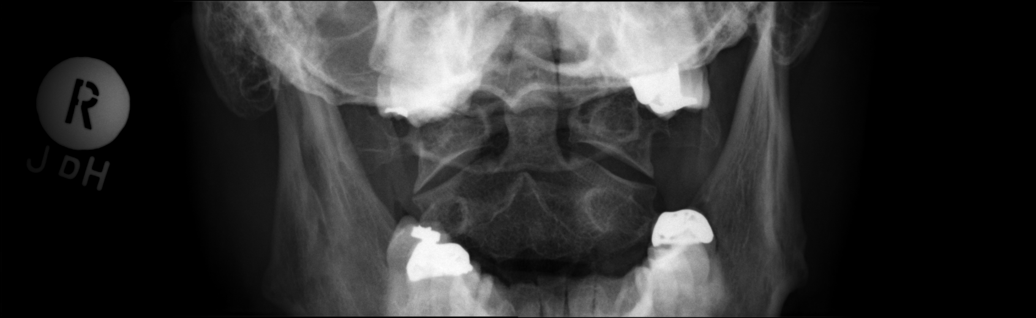

[w cervical swimmers]
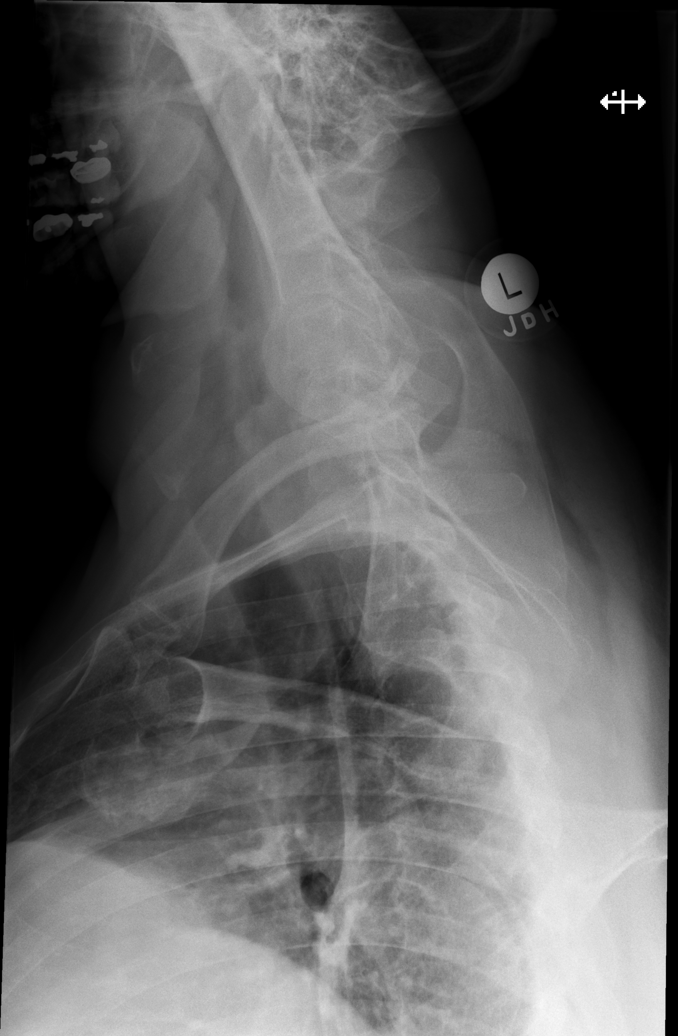

[6 of 6 positions shown; findings below may reference images not displayed]

FINDINGS: The cervical vertebral bodies are preserved in height. The disc
space heights are reasonably well-maintained. Small anterior
endplate osteophytes are noted at C4-5 and C5-6. There is mild bony
encroachment upon the C5 neural foramen on the right. There is mild
narrowing of the C6 neural foramen on the left. The prevertebral
soft tissue spaces are normal. The odontoid is intact.
IMPRESSION: Mild bilateral bony encroachment upon the neural foramina as
described. No compression fracture nor high-grade disc space
narrowing.

## 2018-11-20 DIAGNOSIS — G4733 Obstructive sleep apnea (adult) (pediatric): Secondary | ICD-10-CM | POA: Diagnosis not present

## 2018-12-11 ENCOUNTER — Other Ambulatory Visit (HOSPITAL_COMMUNITY)
Admission: RE | Admit: 2018-12-11 | Discharge: 2018-12-11 | Disposition: A | Discharge status: Home / Self Care | Payer: BC Managed Care – PPO | Source: Ambulatory Visit | Attending: Neurology | Admitting: Neurology

## 2018-12-11 DIAGNOSIS — Z1159 Encounter for screening for other viral diseases: Secondary | ICD-10-CM | POA: Insufficient documentation

## 2018-12-11 DIAGNOSIS — Z0000 Encounter for general adult medical examination without abnormal findings: Secondary | ICD-10-CM | POA: Diagnosis not present

## 2018-12-11 DIAGNOSIS — Z125 Encounter for screening for malignant neoplasm of prostate: Secondary | ICD-10-CM | POA: Diagnosis not present

## 2018-12-11 LAB — SARS CORONAVIRUS 2 (TAT 6-24 HRS): SARS Coronavirus 2: NEGATIVE

## 2018-12-13 ENCOUNTER — Telehealth: Payer: Self-pay

## 2018-12-14 ENCOUNTER — Other Ambulatory Visit: Payer: Self-pay

## 2018-12-14 ENCOUNTER — Ambulatory Visit (INDEPENDENT_AMBULATORY_CARE_PROVIDER_SITE_OTHER): Payer: BC Managed Care – PPO | Admitting: Neurology

## 2018-12-14 DIAGNOSIS — I10 Essential (primary) hypertension: Secondary | ICD-10-CM | POA: Diagnosis not present

## 2018-12-14 DIAGNOSIS — Z1211 Encounter for screening for malignant neoplasm of colon: Secondary | ICD-10-CM | POA: Diagnosis not present

## 2018-12-14 DIAGNOSIS — G4734 Idiopathic sleep related nonobstructive alveolar hypoventilation: Secondary | ICD-10-CM

## 2018-12-14 DIAGNOSIS — Z9989 Dependence on other enabling machines and devices: Secondary | ICD-10-CM

## 2018-12-14 DIAGNOSIS — G4733 Obstructive sleep apnea (adult) (pediatric): Secondary | ICD-10-CM

## 2018-12-14 DIAGNOSIS — Z0000 Encounter for general adult medical examination without abnormal findings: Secondary | ICD-10-CM | POA: Diagnosis not present

## 2018-12-14 DIAGNOSIS — Z23 Encounter for immunization: Secondary | ICD-10-CM | POA: Diagnosis not present

## 2018-12-14 DIAGNOSIS — G4720 Circadian rhythm sleep disorder, unspecified type: Secondary | ICD-10-CM

## 2018-12-14 DIAGNOSIS — J309 Allergic rhinitis, unspecified: Secondary | ICD-10-CM | POA: Diagnosis not present

## 2018-12-14 DIAGNOSIS — Z6834 Body mass index (BMI) 34.0-34.9, adult: Secondary | ICD-10-CM | POA: Diagnosis not present

## 2018-12-17 ENCOUNTER — Encounter: Payer: Self-pay | Admitting: Adult Health

## 2018-12-19 ENCOUNTER — Encounter: Payer: Self-pay | Admitting: Neurology

## 2018-12-19 ENCOUNTER — Ambulatory Visit: Payer: BC Managed Care – PPO | Admitting: Neurology

## 2018-12-19 ENCOUNTER — Other Ambulatory Visit: Payer: Self-pay

## 2018-12-19 VITALS — BP 117/71 | HR 76 | Ht 68.0 in | Wt 225.0 lb

## 2018-12-19 DIAGNOSIS — G4733 Obstructive sleep apnea (adult) (pediatric): Secondary | ICD-10-CM | POA: Diagnosis not present

## 2018-12-19 DIAGNOSIS — Z9989 Dependence on other enabling machines and devices: Secondary | ICD-10-CM

## 2018-12-19 NOTE — Progress Notes (Signed)
Patient had a CPAP titration study on 12/14/18 and has been on autoPAP, had an abn ONO in May 2020, has appt today for his autoPAP compl. I will advise him to change to CPAP of 17 cm with EPR of 3. Addition of chinstrap recommended d/t mouth venting noted during REM sleep, but did not achieve sleep when he tried a FFM.  He will be asked to return for FU in 3 months.  Thanks,   Star Age, MD, PhD [facility])

## 2018-12-19 NOTE — Patient Instructions (Signed)
Keep up the good work! We can see you in 6 months, you can see one of our nurse practitioners as you are stable.  As discussed, we will change her pressure setting from AutoPap to CPAP of 17 cm, please let us know if you have a hard time tolerating this, we can reduce the pressure a little bit.  You do not have to use supplemental oxygen once you start the CPAP of 17 cm.Your most recent sleep study on 12/14/2018 indicated no need for supplemental oxygen.   Please continue using your CPAP regularly. While your insurance requires that you use CPAP at least 4 hours each night on 70% of the nights, I recommend, that you not skip any nights and use it throughout the night if you can. Getting used to CPAP and staying with the treatment long term does take time and patience and discipline. Untreated obstructive sleep apnea when it is moderate to severe can have an adverse impact on cardiovascular health and raise her risk for heart disease, arrhythmias, hypertension, congestive heart failure, stroke and diabetes. Untreated obstructive sleep apnea causes sleep disruption, nonrestorative sleep, and sleep deprivation. This can have an impact on your day to day functioning and cause daytime sleepiness and impairment of cognitive function, memory loss, mood disturbance, and problems focussing. Using CPAP regularly can improve these symptoms.

## 2018-12-19 NOTE — Progress Notes (Signed)
Order for cpap pressure change sent to [facility] via community message. Confirmation received that the order transmitted was successful.  

## 2018-12-19 NOTE — Addendum Note (Signed)
Addended by: Star Age on: 12/19/2018 08:43 AM   Modules accepted: Orders

## 2018-12-19 NOTE — Progress Notes (Signed)
Subjective:    Patient ID: Edwin Nelson is a 48 y.o. male.  HPI     Interim history:   Edwin Nelson is a 48 year old right-handed gentleman with an underlying medical history of allergies, diabetes, hypertension, asthma, and obesity, who presents for follow-up consultation of his severe obstructive sleep apnea, after home sleep testing, overnight pulse oximetry testing, and more recent CPAP titration study testing.  Patient is unaccompanied today.  I first met him on 07/11/2018 at the request of his primary care physician, at which time he reported snoring and witnessed apneas.  He was advised to proceed with sleep study testing.  He had a home sleep test on 09/06/2018 which indicated severe sleep apnea with an AHI of 31.2/h, and O2 nadir of 51%.  He had significant time below or at 88% saturation for the night.  He was advised to start AutoPap therapy.  He had an interim overnight pulse oximetry test on 10/11/2018 which showed an O2 nadir of 59% while on AutoPap therapy with significant time below or at 88% saturation of 38 minutes, average oxygen saturation 94%.  At that point, I suggested we proceed with an overnight CPAP titration study.  He had this on 12/14/2018: His sleep efficiency was reduced at 70.4%, sleep latency 6.5 minutes, REM latency delayed at 152.5 minutes with minimal REM sleep noted at 1.6% only.  He was fitted with his own nasal interface from [employer], seal was good until he had mouth opening noticed during REM sleep.  He was titrated on CPAP from 6 cm to 17 cm and briefly tried on BiPAP but he did not sleep well on BiPAP therapy.  Supplemental oxygen was not used, on a CPAP pressure of 17 cm his AHI was 0/h with supine sleep achieved and minimal REM sleep achieved, O2 nadir of 92%.  Today, 12/19/2018: I reviewed his AutoPap compliance data from 11/18/2018 through 12/17/2018 which is a total of 30 days, during which time he used his AutoPap 27 days with percent used days greater than 4  hours at 90%, indicating excellent compliance with an average usage of 8 hours and 5 minutes, residual AHI at goal at 1.9/h, 95th percentile of pressure at 12.6 cm with a range of 7 cm to 15 cm with EPR, leak on the low side with a 95th percentile at 2.5 L/min.  Set up date was 10/02/2018.  He reports that he has adapted well to AutoPap therapy and indicates good results including more restful sleep, less disruption, less tossing and turning and his snoring is minimal to none.  He skipped 2 days because he was out of town, had taken his AutoPap machine with him but had not taken the adapter for the hose.  He uses supplemental oxygen based on his abnormal pulse oximetry test results.  He is wondering if he needs any additional oxygen at this time once he changes to the CPAP, he is willing to try CPAP of 17 cm.  Once he started using AutoPap his mouth dryness improved because of the humidity added.  His weight has been stable.  He works from home most of the time.  He is highly motivated to continue with treatment.  The patient's allergies, current medications, family history, past medical history, past social history, past surgical history and problem list were reviewed and updated as appropriate.   Previously:   07/11/2018: (He) reports snoring and witnessed breathing pauses while asleep, per wife's report. I reviewed your office note from 06/05/2018 when  he saw Griselda Miner, [place]. His Epworth sleepiness score is 3 out of 24 today, fatigue score is 17 out of 63. He lives with his spouse, they have no children. He works from home. He quit smoking in 2008, drinks alcohol about 4 nights out of the week, 4-5 drinks, caffeine in the form of coffee, one cup in the morning typically. He works for [employer] and has to travel once a month typically. His bedtime generally speaking is around 11 PM and rise time between 7 and 7:30 AM. He denies night to night nocturia or recurrent morning headaches. He is not aware of  any family history of OSA. He had a tonsillectomy and am entry school. They have no pets in the household. He does not watch TV in the bedroom. He has had some increase in blood pressure values and has been tracking his blood pressure at home. He had an increase in his lisinopril with time. He has woken up with a sense of gasping rarely.  His Past Medical History Is Significant For: Past Medical History:  Diagnosis Date  . Diabetes mellitus without complication ([place])   . Hyperlipidemia   . Hypertension     His Past Surgical History Is Significant For: History reviewed. No pertinent surgical history.  His Family History Is Significant For: Family History  Problem Relation Age of Onset  . Diabetes Father   . Heart disease Father   . Stroke Father     His Social History Is Significant For: Social History   Socioeconomic History  . Marital status: Single    Spouse name: Not on file  . Number of children: Not on file  . Years of education: Not on file  . Highest education level: Not on file  Occupational History  . Not on file  Social Needs  . Financial resource strain: Not on file  . Food insecurity    Worry: Not on file    Inability: Not on file  . Transportation needs    Medical: Not on file    Non-medical: Not on file  Tobacco Use  . Smoking status: Former Research scientist (life sciences)  . Smokeless tobacco: Never Used  Substance and Sexual Activity  . Alcohol use: Yes  . Drug use: No  . Sexual activity: Not on file  Lifestyle  . Physical activity    Days per week: Not on file    Minutes per session: Not on file  . Stress: Not on file  Relationships  . Social Herbalist on phone: Not on file    Gets together: Not on file    Attends religious service: Not on file    Active member of club or organization: Not on file    Attends meetings of clubs or organizations: Not on file    Relationship status: Not on file  Other Topics Concern  . Not on file  Social History Narrative   . Not on file    His Allergies Are:  No Known Allergies:   His Current Medications Are:  Outpatient Encounter Medications as of 12/19/2018  Medication Sig  . azelastine (ASTELIN) 0.1 % nasal spray SPRAY TWICE INTRANASALLY 2 TIMES PER DAY IN EACH NOSTRIL  . Blood Glucose Monitoring Suppl (ONE TOUCH ULTRA SYSTEM KIT) W/DEVICE KIT 1 kit by Does not apply route once.  . fluticasone (FLONASE) 50 MCG/ACT nasal spray USE 1 SPRAY NASALLY TWICE A DAY  . glucose blood test strip Use up to twice daily, dx 250.00  .  Icosapent Ethyl (VASCEPA PO) Take by mouth.  . Lancets (ONETOUCH ULTRASOFT) lancets Use up to twice daily, dx 250.00  . lisinopril (PRINIVIL,ZESTRIL) 30 MG tablet Take 30 mg by mouth daily.  . metFORMIN (GLUCOPHAGE) 500 MG tablet One half tablet prior to breakfast  . PROVENTIL HFA 108 (90 BASE) MCG/ACT inhaler INHALE 2 PUFFS THREE TIMES A DAY AS NEEDED  . QVAR 80 MCG/ACT inhaler INHALE 1 PUFF INTO THE LUNGS TWICE A DAY  . rosuvastatin (CRESTOR) 10 MG tablet Take 10 mg by mouth daily.  Marland Kitchen VASCEPA 1 g CAPS    No facility-administered encounter medications on file as of 12/19/2018.   :  Review of Systems:  Out of a complete 14 point review of systems, all are reviewed and negative with the exception of these symptoms as listed below:  Review of Systems  Neurological:       Pt presents today to discuss his sleep study results. Pt is doing well with the auto pap.    Objective:  Neurological Exam  Physical Exam Physical Examination:   Vitals:   12/19/18 1129  BP: 117/71  Pulse: 76    General Examination: The patient is a very pleasant 48 y.o. male in no acute distress. He appears well-developed and well-nourished and well groomed.   HEENT: Normocephalic, atraumatic, pupils are equal, round and reactive to light and accommodation. Corrective eyeglasses in place. Extraocular tracking is good without limitation to gaze excursion or nystagmus noted. Normal smooth pursuit is noted.  Hearing is grossly intact. Face is symmetric with normal facial animation and normal facial sensation. Speech is clear with no dysarthria noted. There is no hypophonia. There is no lip, neck/head, jaw or voice tremor. Neck is supple with full range of passive and active motion. There are no carotid bruits on auscultation. Oropharynx exam reveals: no mouth dryness, good dental hygiene and moderate airway crowding. Tongue protrudes centrally and palate elevates symmetrically.   Chest: Clear to auscultation without wheezing, rhonchi or crackles noted.  Heart: S1+S2+0, regular and normal without murmurs, rubs or gallops noted.   Abdomen: Soft, non-tender and non-distended with normal bowel sounds appreciated on auscultation.  Extremities: There is no pitting edema in the distal lower extremities bilaterally.   Skin: Warm and dry without trophic changes noted.  Musculoskeletal: exam reveals no obvious joint deformities, tenderness or joint swelling or erythema.   Neurologically:  Mental status: The patient is awake, alert and oriented in all 4 spheres. His immediate and remote memory, attention, language skills and fund of knowledge are appropriate. There is no evidence of aphasia, agnosia, apraxia or anomia. Speech is clear with normal prosody and enunciation. Thought process is linear. Mood is normal and affect is normal.  Cranial nerves II - XII are as described above under HEENT exam.  Motor exam: Normal bulk, strength and tone is noted. There is no tremor.  Romberg is neg. Fine motor skills and coordination: grossly intact.  Cerebellar testing: No dysmetria or intention tremor. There is no truncal or gait ataxia.  Sensory exam: intact to light touch.   Gait, station and balance: He stands easily. No veering to one side is noted. No leaning to one side is noted. Posture is age-appropriate and stance is narrow based. Gait shows normal stride length and normal pace. No problems turning are  noted.                Assessment and Plan:  In summary, Argenis A Garraway is a very pleasant 20-year  old male with an underlying medical history of allergies, diabetes, hypertension, asthma, and obesity, who Presents for follow-up consultation of his severe obstructive sleep apnea as determined by his home sleep test on 09/04/2018.  He had been on AutoPap therapy since early May 2020, had an abnormal pulse oximetry test later in May 2020 which prompted a full night titration study, which he had on 12/14/2018.  Based on his test results I would like for him to try CPAP therapy at home at 17 cm with EPR.  He may benefit from using a chinstrap.  He is advised to let us know if he has trouble tolerating the high pressure.  We can scale back a little bit.  He has been using supplemental oxygen with AutoPap at home.  He is advised that once he is able to use CPAP consistently he will not need supplemental oxygen any longer, his sleep study on 12/14/2018 did not show any need for supplemental oxygen.  He is advised to follow-up in 6 months routinely, we can hopefully discontinue the order for oxygen soon.  He is advised to keep in touch and we can also look at a compliance download once he is established on CPAP therapy.  I answered all his questions today and he was in agreement. I spent 25 minutes in total face-to-face time with the patient, more than 50% of which was spent in counseling and coordination of care, reviewing test results, reviewing medication and discussing or reviewing the diagnosis of OSA, its prognosis and treatment options. Pertinent laboratory and imaging test results that were available during this visit with the patient were reviewed by me and considered in my medical decision making (see chart for details).

## 2018-12-19 NOTE — Procedures (Signed)
S PATIENT'S NAME:  Edwin Nelson, Edwin Nelson DOB:      [birth year]      MR#:    [id number]     DATE OF RECORDING: 12/14/2018 REFERRING M.D.:  Rachell Cipro, MD Study Performed:   CPAP  Titration HISTORY: 48 year old man with a history of allergies, diabetes, hypertension, asthma, and obesity, who presents for a full night titration study to optimize treatment. He has had ongoing desaturations on an ONO on autoPAP in May 2020. The patient endorsed the Epworth Sleepiness Scale at 3 points. The patient's weight 229 pounds with a height of 68 (inches), resulting in a BMI of 34.7 kg/m2. The patient's neck circumference measured 18.2 inches.  CURRENT MEDICATIONS:  Flonase, Vascepa, Lisinopril, Glucophage, Proventil, Qvar, Crestor  PROCEDURE:  This is a multichannel digital polysomnogram utilizing the SomnoStar 11.2 system.  Electrodes and sensors were applied and monitored per AASM Specifications.   EEG, EOG, Chin and Limb EMG, were sampled at 200 Hz.  ECG, Snore and Nasal Pressure, Thermal Airflow, Respiratory Effort, CPAP Flow and Pressure, Oximetry was sampled at 50 Hz. Digital video and audio were recorded.      The patient was fitted with his large [employer] nasal interface. CPAP was initiated at 6 cmH20 with heated humidity per AASM standards and pressure was advanced to 17 cmH20 because of hypopneas, apneas and desaturations. He was briefly tried on BiPAP of 17/12 cm and 17/13 cm. He was noted to have mouth venting in REM sleep, but was not able to achieve sleep on a full face mask. At a PAP pressure of 17 cmH20, there was a reduction of the AHI to 0/hour, with supine sleep achieved, minimal REM sleep achieved and O2 nadir of 92%.  Lights Out was at 22:30 and Lights On at 04:35. Total recording time (TRT) was 366 minutes, with a total sleep time (TST) of 257.5 minutes. The patient's sleep latency was 6.5 minutes. REM latency was 152.5 minutes, which is delayed. The sleep efficiency was 70.4%, which is reduced.     SLEEP ARCHITECTURE: WASO (Wake after sleep onset) was 46.5 minutes with mild to moderate sleep fragmentation noted. There were 48.5 minutes in Stage N1, 198.5 minutes Stage N2, 6.5 minutes Stage N3 and 4 minutes in Stage REM.  The percentage of Stage N1 was 18.8%, which is increased, Stage N2 was 77.1%, which is increased, Stage N3 was 2.5% and Stage R (REM sleep) was 1.6%, which is markedly reduced. The arousals were noted as: 35 were spontaneous, 0 were associated with PLMs, 11 were associated with respiratory events.  RESPIRATORY ANALYSIS:  There was a total of 27 respiratory events: 0 obstructive apneas, 0 central apneas and 0 mixed apneas with a total of 0 apneas and an apnea index (AI) of 0 /hour. There were 27 hypopneas with a hypopnea index of 6.3/hour. The patient also had 0 respiratory event related arousals (RERAs).      The total APNEA/HYPOPNEA INDEX  (AHI) was 6.3 /hour and the total RESPIRATORY DISTURBANCE INDEX was 6.3 /hour  1 events occurred in REM sleep and 26 events in NREM. The REM AHI was 15 /hour versus a non-REM AHI of 6.2 /hour.  The patient spent 257.5 minutes of total sleep time in the supine position and 0 minutes in non-supine. The supine AHI was 6.3, versus a non-supine AHI of 0.0.  OXYGEN SATURATION & C02:  The baseline 02 saturation was 96%, with the lowest being 90%. Time spent below 89% saturation equaled 0 minutes.  PERIODIC LIMB  MOVEMENTS:  The patient had a total of 0 Periodic Limb Movements. The Periodic Limb Movement (PLM) index was 0 and the PLM Arousal index was 0 /hour.  Audio and video analysis did not show any abnormal or unusual movements, behaviors, phonations or vocalizations. The patient took no bathroom breaks. The EKG was in keeping with normal sinus rhythm (NSR). Post-study, the patient indicated that sleep was the same as usual.   DIAGNOSIS 1. Obstructive Sleep Apnea  2. Dysfunctions associated with sleep stages or arousals from  sleep  PLANS/RECOMMENDATIONS: 1. This study demonstrates good improvement of the patient's obstructive sleep apnea with CPAP therapy. I will, therefore, ask the patient to switch from autoPAP to home CPAP treatment at a pressure of 17 cm via nasal interface of choice with recommendation of using a chinstrap, due to mouth venting noted in REM sleep. The patient should be reminded to be fully compliant with PAP therapy to improve sleep related symptoms and decrease long term cardiovascular risks. The patient should be reminded, that it may take up to 3 months to get fully used to using PAP with all planned sleep. The earlier full compliance is achieved, the better long term compliance tends to be. Please note that untreated obstructive sleep apnea may carry additional perioperative morbidity. Patients with significant obstructive sleep apnea should receive perioperative PAP therapy and the surgeons and particularly the anesthesiologist should be informed of the diagnosis and the severity of the sleep disordered breathing. 2. This study shows sleep fragmentation and abnormal sleep stage percentages; these are nonspecific findings and per se do not signify an intrinsic sleep disorder or a cause for the patient's sleep-related symptoms. Causes include (but are not limited to) the first night effect of the sleep study, circadian rhythm disturbances, medication effect or an underlying mood disorder or medical problem.  3. The patient should be cautioned not to drive, work at heights, or operate dangerous or heavy equipment when tired or sleepy. Review and reiteration of good sleep hygiene measures should be pursued with any patient. 4. The patient will be seen in follow-up in the sleep clinic at [facility] for discussion of the test results, symptom and treatment compliance review, further management strategies, etc. The referring provider will be notified of the test results.  I certify that I have reviewed the entire  raw data recording prior to the issuance of this report in accordance with the Standards of Accreditation of the American Academy of Sleep Medicine (AASM)  Star Age, MD, PhD Diplomat, American Board of Neurology and Sleep Medicine (Neurology and Sleep Medicine)

## 2018-12-20 DIAGNOSIS — G4733 Obstructive sleep apnea (adult) (pediatric): Secondary | ICD-10-CM | POA: Diagnosis not present

## 2019-01-05 DIAGNOSIS — I10 Essential (primary) hypertension: Secondary | ICD-10-CM | POA: Diagnosis not present

## 2019-01-05 DIAGNOSIS — E119 Type 2 diabetes mellitus without complications: Secondary | ICD-10-CM | POA: Diagnosis not present

## 2019-01-05 DIAGNOSIS — E785 Hyperlipidemia, unspecified: Secondary | ICD-10-CM | POA: Diagnosis not present

## 2019-01-09 DIAGNOSIS — E782 Mixed hyperlipidemia: Secondary | ICD-10-CM | POA: Diagnosis not present

## 2019-01-09 DIAGNOSIS — I10 Essential (primary) hypertension: Secondary | ICD-10-CM | POA: Diagnosis not present

## 2019-01-09 DIAGNOSIS — G4733 Obstructive sleep apnea (adult) (pediatric): Secondary | ICD-10-CM | POA: Diagnosis not present

## 2019-01-09 DIAGNOSIS — E119 Type 2 diabetes mellitus without complications: Secondary | ICD-10-CM | POA: Diagnosis not present

## 2019-01-16 DIAGNOSIS — Z23 Encounter for immunization: Secondary | ICD-10-CM | POA: Diagnosis not present

## 2019-01-19 DIAGNOSIS — G4733 Obstructive sleep apnea (adult) (pediatric): Secondary | ICD-10-CM | POA: Diagnosis not present

## 2019-01-31 ENCOUNTER — Telehealth: Payer: Self-pay | Admitting: Neurology

## 2019-01-31 ENCOUNTER — Encounter: Payer: Self-pay | Admitting: Neurology

## 2019-01-31 DIAGNOSIS — G4733 Obstructive sleep apnea (adult) (pediatric): Secondary | ICD-10-CM

## 2019-01-31 NOTE — Telephone Encounter (Signed)
Please call patient regarding his CPAP compliance.  Based on his latest titration study, he did not require any supplemental oxygen.  In the past 30 days his compliance is excellent with an average of 7 hours and 2 minutes usage, AHI is at goal at 1.3/h, leak acceptable with a 95th percentile at 7.6 L/min on a pressure of 17 cm with EPR of 3.Please advise him to continue with the current settings and mask and with full compliance.  He is highly commended for his treatment adherence and no longer needs supplemental oxygen.  He can talk to his DME company regarding returning the oxygen concentrator and follow-up as scheduled with Jinny Blossom in January.

## 2019-01-31 NOTE — Telephone Encounter (Signed)
I called pt and discussed this with him. He will discuss with [employer] how to return his oxygen concentrator. Pt verbalized understanding of results and recommendations. Pt had no questions at this time but was encouraged to call back if questions arise.

## 2019-02-02 ENCOUNTER — Encounter: Payer: Self-pay | Admitting: Neurology

## 2019-02-07 NOTE — Addendum Note (Signed)
Addended by: Lester [place] A on: 02/07/2019 07:15 AM   Modules accepted: Orders

## 2019-02-19 DIAGNOSIS — G4733 Obstructive sleep apnea (adult) (pediatric): Secondary | ICD-10-CM | POA: Diagnosis not present

## 2019-02-22 DIAGNOSIS — Z23 Encounter for immunization: Secondary | ICD-10-CM | POA: Diagnosis not present

## 2019-03-21 DIAGNOSIS — G4733 Obstructive sleep apnea (adult) (pediatric): Secondary | ICD-10-CM | POA: Diagnosis not present

## 2019-03-27 DIAGNOSIS — H52203 Unspecified astigmatism, bilateral: Secondary | ICD-10-CM | POA: Diagnosis not present

## 2019-03-27 DIAGNOSIS — H524 Presbyopia: Secondary | ICD-10-CM | POA: Diagnosis not present

## 2019-03-27 DIAGNOSIS — E119 Type 2 diabetes mellitus without complications: Secondary | ICD-10-CM | POA: Diagnosis not present

## 2019-03-27 DIAGNOSIS — H5213 Myopia, bilateral: Secondary | ICD-10-CM | POA: Diagnosis not present

## 2019-04-06 DIAGNOSIS — E119 Type 2 diabetes mellitus without complications: Secondary | ICD-10-CM | POA: Diagnosis not present

## 2019-04-10 DIAGNOSIS — J309 Allergic rhinitis, unspecified: Secondary | ICD-10-CM | POA: Diagnosis not present

## 2019-04-10 DIAGNOSIS — I10 Essential (primary) hypertension: Secondary | ICD-10-CM | POA: Diagnosis not present

## 2019-04-10 DIAGNOSIS — E119 Type 2 diabetes mellitus without complications: Secondary | ICD-10-CM | POA: Diagnosis not present

## 2019-06-27 ENCOUNTER — Ambulatory Visit: Payer: BC Managed Care – PPO | Admitting: Adult Health

## 2019-07-10 DIAGNOSIS — E119 Type 2 diabetes mellitus without complications: Secondary | ICD-10-CM | POA: Diagnosis not present

## 2019-07-10 DIAGNOSIS — U071 COVID-19: Secondary | ICD-10-CM | POA: Diagnosis not present

## 2019-07-10 DIAGNOSIS — I10 Essential (primary) hypertension: Secondary | ICD-10-CM | POA: Diagnosis not present

## 2019-08-03 DIAGNOSIS — G4733 Obstructive sleep apnea (adult) (pediatric): Secondary | ICD-10-CM | POA: Diagnosis not present

## 2019-08-06 ENCOUNTER — Ambulatory Visit: Payer: BC Managed Care – PPO | Attending: Internal Medicine

## 2019-08-06 DIAGNOSIS — Z23 Encounter for immunization: Secondary | ICD-10-CM | POA: Insufficient documentation

## 2019-08-06 NOTE — Progress Notes (Signed)
   Covid-19 Vaccination Clinic  Name:  Edwin Nelson    MRN: [id number] DOB: 1970-07-03  08/06/2019  [name] was observed post Covid-19 immunization for 15 minutes without incident. He was provided with Vaccine Information Sheet and instruction to access the V-Safe system.   [name] was instructed to call 911 with any severe reactions post vaccine: Marland Kitchen Difficulty breathing  . Swelling of face and throat  . A fast heartbeat  . A bad rash all over body  . Dizziness and weakness   Immunizations Administered    Name Date Dose VIS Date Route   Pfizer COVID-19 Vaccine 08/06/2019  3:13 PM 0.3 mL 05/11/2019 Intramuscular   Manufacturer: [place]   Lot: [id number]   [place]: [id number]

## 2019-08-21 ENCOUNTER — Encounter: Payer: Self-pay | Admitting: Adult Health

## 2019-08-23 ENCOUNTER — Encounter: Payer: Self-pay | Admitting: Adult Health

## 2019-08-23 ENCOUNTER — Other Ambulatory Visit: Payer: Self-pay

## 2019-08-23 ENCOUNTER — Ambulatory Visit: Payer: BC Managed Care – PPO | Admitting: Adult Health

## 2019-08-23 VITALS — BP 114/70 | HR 92 | Temp 97.5°F | Ht 68.0 in | Wt 234.0 lb

## 2019-08-23 DIAGNOSIS — G4733 Obstructive sleep apnea (adult) (pediatric): Secondary | ICD-10-CM

## 2019-08-23 DIAGNOSIS — Z9989 Dependence on other enabling machines and devices: Secondary | ICD-10-CM

## 2019-08-23 NOTE — Patient Instructions (Signed)
Continue using CPAP nightly and greater than 4 hours each night °If your symptoms worsen or you develop new symptoms please let us know.  ° °

## 2019-08-23 NOTE — Progress Notes (Addendum)
PATIENT: Edwin Nelson DOB: 1971-02-18  REASON FOR VISIT: follow up HISTORY FROM: patient  HISTORY OF PRESENT ILLNESS: Today 08/23/19:  Mr. Edwin Nelson is a 49 year old male with a history of obstructive sleep apnea on CPAP.  His download indicates that he uses machine nightly for compliance of 100%.  He uses machine greater than 4 hours each night.  On average he uses his machine 8 hours and 9 minutes.  His residual AHI is 1.6 on 17 cm of water with EPR of 3.  Leak in the 95th percentile is 1.8.  Reports CPAP is working well for him.  HISTORY 12/19/2018: I reviewed his AutoPap compliance data from 11/18/2018 through 12/17/2018 which is a total of 30 days, during which time he used his AutoPap 27 days with percent used days greater than 4 hours at 90%, indicating excellent compliance with an average usage of 8 hours and 5 minutes, residual AHI at goal at 1.9/h, 95th percentile of pressure at 12.6 cm with a range of 7 cm to 15 cm with EPR, leak on the low side with a 95th percentile at 2.5 L/min.  Set up date was 10/02/2018.  He reports that he has adapted well to AutoPap therapy and indicates good results including more restful sleep, less disruption, less tossing and turning and his snoring is minimal to none.  He skipped 2 days because he was out of town, had taken his AutoPap machine with him but had not taken the adapter for the hose.  He uses supplemental oxygen based on his abnormal pulse oximetry test results.  He is wondering if he needs any additional oxygen at this time once he changes to the CPAP, he is willing to try CPAP of 17 cm.  Once he started using AutoPap his mouth dryness improved because of the humidity added.  His weight has been stable.  He works from home most of the time.  He is highly motivated to continue with treatment.  REVIEW OF SYSTEMS: Out of a complete 14 system review of symptoms, the patient complains only of the following symptoms, and all other reviewed systems are  negative.  ESS 0  ALLERGIES: No Known Allergies  HOME MEDICATIONS: Outpatient Medications Prior to Visit  Medication Sig Dispense Refill  . Blood Glucose Monitoring Suppl (ONE TOUCH ULTRA SYSTEM KIT) W/DEVICE KIT 1 kit by Does not apply route once. 1 each 0  . fluticasone (FLONASE) 50 MCG/ACT nasal spray USE 1 SPRAY NASALLY TWICE A DAY 16 g 10  . glucose blood test strip Use up to twice daily, dx 250.00 100 each 3  . Icosapent Ethyl (VASCEPA PO) Take by mouth.    . Lancets (ONETOUCH ULTRASOFT) lancets Use up to twice daily, dx 250.00 100 each 3  . lisinopril (PRINIVIL,ZESTRIL) 30 MG tablet Take 30 mg by mouth daily.    . metFORMIN (GLUCOPHAGE) 500 MG tablet One half tablet prior to breakfast (Patient taking differently: 500 mg. One AND a half tablet prior to breakfast) 100 tablet 3  . metoprolol succinate (TOPROL-XL) 50 MG 24 hr tablet Take 50 mg by mouth daily.     . montelukast (SINGULAIR) 10 MG tablet Take 10 mg by mouth daily.    Marland Kitchen PROVENTIL HFA 108 (90 BASE) MCG/ACT inhaler INHALE 2 PUFFS THREE TIMES A DAY AS NEEDED 18 g 1  . QVAR 80 MCG/ACT inhaler INHALE 1 PUFF INTO THE LUNGS TWICE A DAY 3 g 3  . rosuvastatin (CRESTOR) 10 MG tablet Take 10  mg by mouth daily.    Marland Kitchen VASCEPA 1 g CAPS     . azelastine (ASTELIN) 0.1 % nasal spray SPRAY TWICE INTRANASALLY 2 TIMES PER DAY IN EACH NOSTRIL     No facility-administered medications prior to visit.    PAST MEDICAL HISTORY: Past Medical History:  Diagnosis Date  . Diabetes mellitus without complication ([place])   . Hyperlipidemia   . Hypertension     PAST SURGICAL HISTORY: No past surgical history on file.  FAMILY HISTORY: Family History  Problem Relation Age of Onset  . Diabetes Father   . Heart disease Father   . Stroke Father     SOCIAL HISTORY: Social History   Socioeconomic History  . Marital status: Single    Spouse name: Not on file  . Number of children: Not on file  . Years of education: Not on file  . Highest  education level: Not on file  Occupational History  . Not on file  Tobacco Use  . Smoking status: Former Research scientist (life sciences)  . Smokeless tobacco: Never Used  Substance and Sexual Activity  . Alcohol use: Yes  . Drug use: No  . Sexual activity: Not on file  Other Topics Concern  . Not on file  Social History Narrative  . Not on file   Social Determinants of Health   Financial Resource Strain:   . Difficulty of Paying Living Expenses:   Food Insecurity:   . Worried About Charity fundraiser in the Last Year:   . Arboriculturist in the Last Year:   Transportation Needs:   . Film/video editor (Medical):   Marland Kitchen Lack of Transportation (Non-Medical):   Physical Activity:   . Days of Exercise per Week:   . Minutes of Exercise per Session:   Stress:   . Feeling of Stress :   Social Connections:   . Frequency of Communication with Friends and Family:   . Frequency of Social Gatherings with Friends and Family:   . Attends Religious Services:   . Active Member of Clubs or Organizations:   . Attends Archivist Meetings:   Marland Kitchen Marital Status:   Intimate Partner Violence:   . Fear of Current or Ex-Partner:   . Emotionally Abused:   Marland Kitchen Physically Abused:   . Sexually Abused:       PHYSICAL EXAM  Vitals:   08/23/19 0816  BP: 114/70  Pulse: 92  Temp: (!) 97.5 F (36.4 C)  Weight: 234 lb (106.1 kg)  Height: 5' 8"  (1.727 m)   Body mass index is 35.58 kg/m.  Generalized: Well developed, in no acute distress  Chest: Lungs clear to auscultation bilaterally  Neurological examination  Mentation: Alert oriented to time, place, history taking. Follows all commands speech and language fluent Cranial nerve II-XII: Extraocular movements were full, visual field were full on confrontational test Head turning and shoulder shrug  were normal and symmetric. Motor: The motor testing reveals 5 over 5 strength of all 4 extremities. Good symmetric motor tone is noted throughout.  Sensory:  Sensory testing is intact to soft touch on all 4 extremities. No evidence of extinction is noted.  Gait and station: Gait is normal.    DIAGNOSTIC DATA (LABS, IMAGING, TESTING) - I reviewed patient records, labs, notes, testing and imaging myself where available.  Lab Results  Component Value Date   WBC 4.5 08/23/2012   HGB 14.8 08/23/2012   HCT 43.2 08/23/2012   MCV 90.7 08/23/2012  PLT 236.0 08/23/2012      Component Value Date/Time   NA 136 08/23/2012 0841   K 4.4 08/23/2012 0841   CL 100 08/23/2012 0841   CO2 25 08/23/2012 0841   GLUCOSE 129 (H) 08/23/2012 0841   BUN 14 08/23/2012 0841   CREATININE 1.0 08/23/2012 0841   CALCIUM 10.1 08/23/2012 0841   PROT 7.8 08/23/2012 0841   ALBUMIN 4.4 08/23/2012 0841   AST 51 (H) 08/23/2012 0841   ALT 75 (H) 08/23/2012 0841   ALKPHOS 67 08/23/2012 0841   BILITOT 0.6 08/23/2012 0841   GFRNONAA 100.62 05/22/2009 0000   GFRAA 109 04/22/2008 1010   Lab Results  Component Value Date   CHOL 185 08/23/2012   HDL 47.80 08/23/2012   LDLCALC 41 04/22/2008   LDLDIRECT 60.1 08/23/2012   TRIG (H) 08/23/2012    401.0 Triglyceride is over 400; calculations on Lipids are invalid.   CHOLHDL 4 08/23/2012   Lab Results  Component Value Date   HGBA1C 6.3 (A) 09/07/2012   No results found for: VITAMINB12 Lab Results  Component Value Date   TSH 2.47 08/23/2012      ASSESSMENT AND PLAN 49 y.o. year old male  has a past medical history of Diabetes mellitus without complication ([place]), Hyperlipidemia, and Hypertension. here with:  1. OSA on CPAP  - CPAP compliance excellent - Good treatment of AHI  - Encourage patient to use CPAP nightly and > 4 hours each night - F/U in 1 year or sooner if needed   I spent 20 minutes of face-to-face and non-face-to-face time with patient.  This included previsit chart review, lab review, study review, order entry, electronic health record documentation, patient education.  Ward Givens, MSN,  NP-C 08/23/2019, 8:28 AM [facility] [address], [place], [place] [address] [phone]  I reviewed the above note and documentation by the Nurse Practitioner and agree with the history, exam, assessment and plan as outlined above. I was available for consultation. Star Age, MD, PhD [facility])

## 2019-09-05 ENCOUNTER — Ambulatory Visit: Payer: BC Managed Care – PPO | Attending: Internal Medicine

## 2019-09-05 DIAGNOSIS — Z23 Encounter for immunization: Secondary | ICD-10-CM

## 2019-09-05 NOTE — Progress Notes (Signed)
   Covid-19 Vaccination Clinic  Name:  ANTHANY POLIT    MRN: [id number] DOB: 08-27-1970  09/05/2019  Mr. Arguello was observed post Covid-19 immunization for 15 minutes without incident. He was provided with Vaccine Information Sheet and instruction to access the V-Safe system.   Mr. Polmanteer was instructed to call 911 with any severe reactions post vaccine: Marland Kitchen Difficulty breathing  . Swelling of face and throat  . A fast heartbeat  . A bad rash all over body  . Dizziness and weakness   Immunizations Administered    Name Date Dose VIS Date Route   Pfizer COVID-19 Vaccine 09/05/2019 12:43 PM 0.3 mL 05/11/2019 Intramuscular   Manufacturer: [place]   Lot: [id number]   [place]: [id number]

## 2019-09-28 DIAGNOSIS — Z0000 Encounter for general adult medical examination without abnormal findings: Secondary | ICD-10-CM | POA: Diagnosis not present

## 2019-09-28 DIAGNOSIS — E785 Hyperlipidemia, unspecified: Secondary | ICD-10-CM | POA: Diagnosis not present

## 2019-09-28 DIAGNOSIS — E559 Vitamin D deficiency, unspecified: Secondary | ICD-10-CM | POA: Diagnosis not present

## 2019-09-28 DIAGNOSIS — I10 Essential (primary) hypertension: Secondary | ICD-10-CM | POA: Diagnosis not present

## 2019-09-28 DIAGNOSIS — E119 Type 2 diabetes mellitus without complications: Secondary | ICD-10-CM | POA: Diagnosis not present

## 2019-10-02 DIAGNOSIS — Z23 Encounter for immunization: Secondary | ICD-10-CM | POA: Diagnosis not present

## 2019-10-04 DIAGNOSIS — E782 Mixed hyperlipidemia: Secondary | ICD-10-CM | POA: Diagnosis not present

## 2019-10-04 DIAGNOSIS — E119 Type 2 diabetes mellitus without complications: Secondary | ICD-10-CM | POA: Diagnosis not present

## 2019-10-04 DIAGNOSIS — E559 Vitamin D deficiency, unspecified: Secondary | ICD-10-CM | POA: Diagnosis not present

## 2019-10-04 DIAGNOSIS — I10 Essential (primary) hypertension: Secondary | ICD-10-CM | POA: Diagnosis not present

## 2019-11-07 DIAGNOSIS — G4733 Obstructive sleep apnea (adult) (pediatric): Secondary | ICD-10-CM | POA: Diagnosis not present

## 2019-11-09 DIAGNOSIS — Z0000 Encounter for general adult medical examination without abnormal findings: Secondary | ICD-10-CM | POA: Diagnosis not present

## 2019-11-09 DIAGNOSIS — E1165 Type 2 diabetes mellitus with hyperglycemia: Secondary | ICD-10-CM | POA: Diagnosis not present

## 2019-11-09 DIAGNOSIS — E782 Mixed hyperlipidemia: Secondary | ICD-10-CM | POA: Diagnosis not present

## 2019-11-09 DIAGNOSIS — Z0184 Encounter for antibody response examination: Secondary | ICD-10-CM | POA: Diagnosis not present

## 2019-11-09 DIAGNOSIS — Z125 Encounter for screening for malignant neoplasm of prostate: Secondary | ICD-10-CM | POA: Diagnosis not present

## 2019-11-13 DIAGNOSIS — E782 Mixed hyperlipidemia: Secondary | ICD-10-CM | POA: Diagnosis not present

## 2019-11-13 DIAGNOSIS — R600 Localized edema: Secondary | ICD-10-CM | POA: Diagnosis not present

## 2019-11-13 DIAGNOSIS — E1169 Type 2 diabetes mellitus with other specified complication: Secondary | ICD-10-CM | POA: Diagnosis not present

## 2019-11-13 DIAGNOSIS — I10 Essential (primary) hypertension: Secondary | ICD-10-CM | POA: Diagnosis not present

## 2019-12-19 DIAGNOSIS — Z0000 Encounter for general adult medical examination without abnormal findings: Secondary | ICD-10-CM | POA: Diagnosis not present

## 2019-12-19 DIAGNOSIS — Z1211 Encounter for screening for malignant neoplasm of colon: Secondary | ICD-10-CM | POA: Diagnosis not present

## 2019-12-19 DIAGNOSIS — Z23 Encounter for immunization: Secondary | ICD-10-CM | POA: Diagnosis not present

## 2019-12-19 DIAGNOSIS — J309 Allergic rhinitis, unspecified: Secondary | ICD-10-CM | POA: Diagnosis not present

## 2019-12-20 ENCOUNTER — Encounter: Payer: Self-pay | Admitting: Gastroenterology

## 2019-12-21 DIAGNOSIS — Z0000 Encounter for general adult medical examination without abnormal findings: Secondary | ICD-10-CM | POA: Diagnosis not present

## 2019-12-21 DIAGNOSIS — Z1159 Encounter for screening for other viral diseases: Secondary | ICD-10-CM | POA: Diagnosis not present

## 2019-12-21 DIAGNOSIS — E782 Mixed hyperlipidemia: Secondary | ICD-10-CM | POA: Diagnosis not present

## 2019-12-28 DIAGNOSIS — E1165 Type 2 diabetes mellitus with hyperglycemia: Secondary | ICD-10-CM | POA: Diagnosis not present

## 2019-12-28 DIAGNOSIS — J45909 Unspecified asthma, uncomplicated: Secondary | ICD-10-CM | POA: Diagnosis not present

## 2019-12-28 DIAGNOSIS — I10 Essential (primary) hypertension: Secondary | ICD-10-CM | POA: Diagnosis not present

## 2019-12-28 DIAGNOSIS — E782 Mixed hyperlipidemia: Secondary | ICD-10-CM | POA: Diagnosis not present

## 2020-01-03 DIAGNOSIS — E782 Mixed hyperlipidemia: Secondary | ICD-10-CM | POA: Diagnosis not present

## 2020-01-03 DIAGNOSIS — Z6827 Body mass index (BMI) 27.0-27.9, adult: Secondary | ICD-10-CM | POA: Diagnosis not present

## 2020-01-03 DIAGNOSIS — I10 Essential (primary) hypertension: Secondary | ICD-10-CM | POA: Diagnosis not present

## 2020-01-03 DIAGNOSIS — E1165 Type 2 diabetes mellitus with hyperglycemia: Secondary | ICD-10-CM | POA: Diagnosis not present

## 2020-01-11 DIAGNOSIS — J3081 Allergic rhinitis due to animal (cat) (dog) hair and dander: Secondary | ICD-10-CM | POA: Diagnosis not present

## 2020-01-11 DIAGNOSIS — J3089 Other allergic rhinitis: Secondary | ICD-10-CM | POA: Diagnosis not present

## 2020-01-11 DIAGNOSIS — J4530 Mild persistent asthma, uncomplicated: Secondary | ICD-10-CM | POA: Diagnosis not present

## 2020-01-11 DIAGNOSIS — J301 Allergic rhinitis due to pollen: Secondary | ICD-10-CM | POA: Diagnosis not present

## 2020-01-18 DIAGNOSIS — J301 Allergic rhinitis due to pollen: Secondary | ICD-10-CM | POA: Diagnosis not present

## 2020-01-18 DIAGNOSIS — E1165 Type 2 diabetes mellitus with hyperglycemia: Secondary | ICD-10-CM | POA: Diagnosis not present

## 2020-01-18 DIAGNOSIS — E782 Mixed hyperlipidemia: Secondary | ICD-10-CM | POA: Diagnosis not present

## 2020-01-18 DIAGNOSIS — I10 Essential (primary) hypertension: Secondary | ICD-10-CM | POA: Diagnosis not present

## 2020-01-21 ENCOUNTER — Ambulatory Visit (AMBULATORY_SURGERY_CENTER): Payer: Self-pay

## 2020-01-21 ENCOUNTER — Encounter: Payer: Self-pay | Admitting: Gastroenterology

## 2020-01-21 ENCOUNTER — Other Ambulatory Visit: Payer: Self-pay

## 2020-01-21 VITALS — Ht 68.0 in | Wt 235.0 lb

## 2020-01-21 DIAGNOSIS — J3089 Other allergic rhinitis: Secondary | ICD-10-CM | POA: Diagnosis not present

## 2020-01-21 DIAGNOSIS — J3081 Allergic rhinitis due to animal (cat) (dog) hair and dander: Secondary | ICD-10-CM | POA: Diagnosis not present

## 2020-01-21 DIAGNOSIS — Z1211 Encounter for screening for malignant neoplasm of colon: Secondary | ICD-10-CM

## 2020-01-21 MED ORDER — PLENVU 140 G PO SOLR
1.0000 | ORAL | 0 refills | Status: DC
Start: 2020-01-21 — End: 2020-02-12

## 2020-01-21 NOTE — Progress Notes (Signed)
No egg or soy allergy known to patient  No issues with past sedation with any surgeries or procedures No intubation problems in the past  No FH of Malignant Hyperthermia No diet pills per patient No home 02 use per patient  No blood thinners per patient  Pt denies issues with constipation  No A fib or A flutter  EMMI video via MyChart  COVID 19 guidelines implemented in PV today with Pt and RN  Coupon given to pt in PV today , Code to Pharmacy  COVID vaccines completed on 08/2019 per pt;  Due to the COVID-19 pandemic we are asking patients to follow these guidelines. Please only bring one care partner. Please be aware that your care partner may wait in the car in the parking lot or if they feel like they will be too hot to wait in the car, they may wait in the lobby on the 4th floor. All care partners are required to wear a mask the entire time (we do not have any that we can provide them), they need to practice social distancing, and we will do a Covid check for all patient's and care partners when you arrive. Also we will check their temperature and your temperature. If the care partner waits in their car they need to stay in the parking lot the entire time and we will call them on their cell phone when the patient is ready for discharge so they can bring the car to the front of the building. Also all patient's will need to wear a mask into building.  

## 2020-01-22 DIAGNOSIS — I10 Essential (primary) hypertension: Secondary | ICD-10-CM | POA: Diagnosis not present

## 2020-01-22 DIAGNOSIS — E1165 Type 2 diabetes mellitus with hyperglycemia: Secondary | ICD-10-CM | POA: Diagnosis not present

## 2020-01-22 DIAGNOSIS — Z6827 Body mass index (BMI) 27.0-27.9, adult: Secondary | ICD-10-CM | POA: Diagnosis not present

## 2020-01-22 DIAGNOSIS — E782 Mixed hyperlipidemia: Secondary | ICD-10-CM | POA: Diagnosis not present

## 2020-01-24 DIAGNOSIS — J301 Allergic rhinitis due to pollen: Secondary | ICD-10-CM | POA: Diagnosis not present

## 2020-01-24 DIAGNOSIS — J3081 Allergic rhinitis due to animal (cat) (dog) hair and dander: Secondary | ICD-10-CM | POA: Diagnosis not present

## 2020-01-24 DIAGNOSIS — J3089 Other allergic rhinitis: Secondary | ICD-10-CM | POA: Diagnosis not present

## 2020-01-25 DIAGNOSIS — E782 Mixed hyperlipidemia: Secondary | ICD-10-CM | POA: Diagnosis not present

## 2020-01-25 DIAGNOSIS — I10 Essential (primary) hypertension: Secondary | ICD-10-CM | POA: Diagnosis not present

## 2020-01-25 DIAGNOSIS — E1165 Type 2 diabetes mellitus with hyperglycemia: Secondary | ICD-10-CM | POA: Diagnosis not present

## 2020-01-28 DIAGNOSIS — J3081 Allergic rhinitis due to animal (cat) (dog) hair and dander: Secondary | ICD-10-CM | POA: Diagnosis not present

## 2020-01-28 DIAGNOSIS — J301 Allergic rhinitis due to pollen: Secondary | ICD-10-CM | POA: Diagnosis not present

## 2020-01-28 DIAGNOSIS — J3089 Other allergic rhinitis: Secondary | ICD-10-CM | POA: Diagnosis not present

## 2020-01-30 DIAGNOSIS — J3089 Other allergic rhinitis: Secondary | ICD-10-CM | POA: Diagnosis not present

## 2020-01-30 DIAGNOSIS — J301 Allergic rhinitis due to pollen: Secondary | ICD-10-CM | POA: Diagnosis not present

## 2020-01-30 DIAGNOSIS — J3081 Allergic rhinitis due to animal (cat) (dog) hair and dander: Secondary | ICD-10-CM | POA: Diagnosis not present

## 2020-02-06 DIAGNOSIS — J3089 Other allergic rhinitis: Secondary | ICD-10-CM | POA: Diagnosis not present

## 2020-02-06 DIAGNOSIS — J3081 Allergic rhinitis due to animal (cat) (dog) hair and dander: Secondary | ICD-10-CM | POA: Diagnosis not present

## 2020-02-06 DIAGNOSIS — J301 Allergic rhinitis due to pollen: Secondary | ICD-10-CM | POA: Diagnosis not present

## 2020-02-08 DIAGNOSIS — J3081 Allergic rhinitis due to animal (cat) (dog) hair and dander: Secondary | ICD-10-CM | POA: Diagnosis not present

## 2020-02-08 DIAGNOSIS — J3089 Other allergic rhinitis: Secondary | ICD-10-CM | POA: Diagnosis not present

## 2020-02-08 DIAGNOSIS — J301 Allergic rhinitis due to pollen: Secondary | ICD-10-CM | POA: Diagnosis not present

## 2020-02-11 DIAGNOSIS — J3081 Allergic rhinitis due to animal (cat) (dog) hair and dander: Secondary | ICD-10-CM | POA: Diagnosis not present

## 2020-02-11 DIAGNOSIS — J3089 Other allergic rhinitis: Secondary | ICD-10-CM | POA: Diagnosis not present

## 2020-02-11 DIAGNOSIS — J301 Allergic rhinitis due to pollen: Secondary | ICD-10-CM | POA: Diagnosis not present

## 2020-02-12 ENCOUNTER — Encounter: Payer: Self-pay | Admitting: Gastroenterology

## 2020-02-12 ENCOUNTER — Other Ambulatory Visit: Payer: Self-pay

## 2020-02-12 ENCOUNTER — Ambulatory Visit (AMBULATORY_SURGERY_CENTER): Payer: BC Managed Care – PPO | Admitting: Gastroenterology

## 2020-02-12 VITALS — BP 123/81 | HR 71 | Temp 96.9°F | Resp 22 | Ht 68.0 in | Wt 235.0 lb

## 2020-02-12 DIAGNOSIS — D123 Benign neoplasm of transverse colon: Secondary | ICD-10-CM

## 2020-02-12 DIAGNOSIS — Z1211 Encounter for screening for malignant neoplasm of colon: Secondary | ICD-10-CM

## 2020-02-12 DIAGNOSIS — K635 Polyp of colon: Secondary | ICD-10-CM

## 2020-02-12 DIAGNOSIS — D122 Benign neoplasm of ascending colon: Secondary | ICD-10-CM

## 2020-02-12 DIAGNOSIS — D124 Benign neoplasm of descending colon: Secondary | ICD-10-CM

## 2020-02-12 HISTORY — PX: COLONOSCOPY: SHX174

## 2020-02-12 MED ORDER — SODIUM CHLORIDE 0.9 % IV SOLN: 500.0000 mL | Freq: Once | INTRAVENOUS | Status: DC

## 2020-02-12 NOTE — Op Note (Signed)
[facility] Patient Name: Edwin Nelson Procedure Date: 02/12/2020 7:54 AM MRN: [id number] Endoscopist: Mallie Mussel L. Loletha Carrow , MD Age: 49 Referring MD:  Date of Birth: 06-21-1970 Gender: Male Account #: [id number] Procedure:                Colonoscopy Indications:              Screening for colorectal malignant neoplasm, This                            is the patient's first colonoscopy Medicines:                Monitored Anesthesia Care Procedure:                Pre-Anesthesia Assessment:                           - Prior to the procedure, a History and Physical                            was performed, and patient medications and                            allergies were reviewed. The patient's tolerance of                            previous anesthesia was also reviewed. The risks                            and benefits of the procedure and the sedation                            options and risks were discussed with the patient.                            All questions were answered, and informed consent                            was obtained. Prior Anticoagulants: The patient has                            taken no previous anticoagulant or antiplatelet                            agents. ASA Grade Assessment: III - A patient with                            severe systemic disease. After reviewing the risks                            and benefits, the patient was deemed in                            satisfactory condition to undergo the procedure.  After obtaining informed consent, the colonoscope                            was passed under direct vision. Throughout the                            procedure, the patient's blood pressure, pulse, and                            oxygen saturations were monitored continuously. The                            Colonoscope was introduced through the anus and                            advanced to the the cecum,  identified by                            appendiceal orifice and ileocecal valve. The                            colonoscopy was performed without difficulty. The                            patient tolerated the procedure well. The quality                            of the bowel preparation was excellent. The                            ileocecal valve, appendiceal orifice, and rectum                            were photographed. The bowel preparation used was                            Plenvu. Scope In: 8:14:57 AM Scope Out: 8:38:01 AM Scope Withdrawal Time: 0 hours 20 minutes 11 seconds  Total Procedure Duration: 0 hours 23 minutes 4 seconds  Findings:                 The perianal and digital rectal examinations were                            normal.                           Three sessile polyps were found in the transverse                            colon and ascending colon. The polyps were 3 to 6                            mm in size. These polyps were removed with a cold  snare. Resection and retrieval were complete.                           A 10 mm polyp was found in the descending colon.                            The polyp was flat. The polyp was removed with a                            cold snare. Resection and retrieval were complete.                           The exam was otherwise without abnormality on                            direct and retroflexion views. Complications:            No immediate complications. Estimated Blood Loss:     Estimated blood loss was minimal. Impression:               - Three 3 to 6 mm polyps in the transverse colon                            and in the ascending colon, removed with a cold                            snare. Resected and retrieved.                           - One 10 mm polyp in the descending colon, removed                            with a cold snare. Resected and retrieved.                            - The examination was otherwise normal on direct                            and retroflexion views. Recommendation:           - Patient has a contact number available for                            emergencies. The signs and symptoms of potential                            delayed complications were discussed with the                            patient. Return to normal activities tomorrow.                            Written discharge instructions were provided to the  patient.                           - Resume previous diet.                           - Continue present medications.                           - Await pathology results.                           - Repeat colonoscopy is recommended for                            surveillance. The colonoscopy date will be                            determined after pathology results from today's                            exam become available for review. Charene Mccallister L. Loletha Carrow, MD 02/12/2020 8:43:13 AM This report has been signed electronically.

## 2020-02-12 NOTE — Progress Notes (Signed)
Pt's states no medical or surgical changes since previsit or office visit.  Vitals- Caryl Pina

## 2020-02-12 NOTE — Progress Notes (Signed)
Called to room to assist during endoscopic procedure.  Patient ID and intended procedure confirmed with present staff. Received instructions for my participation in the procedure from the performing physician.  

## 2020-02-12 NOTE — Patient Instructions (Signed)
Read all of the handouts givrn to you by your recovery room nurse.  Thank-you for choosing [place] for your healthcare needs today.  YOU HAD AN ENDOSCOPIC PROCEDURE TODAY AT [facility]:   Refer to the procedure report that was given to you for any specific questions about what was found during the examination.  If the procedure report does not answer your questions, please call your gastroenterologist to clarify.  If you requested that your care partner not be given the details of your procedure findings, then the procedure report has been included in a sealed envelope for you to review at your convenience later.  YOU SHOULD EXPECT: Some feelings of bloating in the abdomen. Passage of more gas than usual.  Walking can help get rid of the air that was put into your GI tract during the procedure and reduce the bloating. If you had a lower endoscopy (such as a colonoscopy or flexible sigmoidoscopy) you may notice spotting of blood in your stool or on the toilet paper. If you underwent a bowel prep for your procedure, you may not have a normal bowel movement for a few days.  Please Note:  You might notice some irritation and congestion in your nose or some drainage.  This is from the oxygen used during your procedure.  There is no need for concern and it should clear up in a day or so.  SYMPTOMS TO REPORT IMMEDIATELY:   Following lower endoscopy (colonoscopy or flexible sigmoidoscopy):  Excessive amounts of blood in the stool  Significant tenderness or worsening of abdominal pains  Swelling of the abdomen that is new, acute  Fever of 100F or higher   Black, tarry-looking stools  For urgent or emergent issues, a gastroenterologist can be reached at any hour by calling [phone]. Do not use MyChart messaging for urgent concerns.    DIET:  We do recommend a small meal at first, but then you may proceed to your regular diet.  Drink plenty of fluids but you should avoid alcoholic  beverages for 24 hours.  ACTIVITY:  You should plan to take it easy for the rest of today and you should NOT DRIVE or use heavy machinery until tomorrow (because of the sedation medicines used during the test).    FOLLOW UP: Our staff will call the number listed on your records 48-72 hours following your procedure to check on you and address any questions or concerns that you may have regarding the information given to you following your procedure. If we do not reach you, we will leave a message.  We will attempt to reach you two times.  During this call, we will ask if you have developed any symptoms of COVID 19. If you develop any symptoms (ie: fever, flu-like symptoms, shortness of breath, cough etc.) before then, please call [phone].  If you test positive for Covid 19 in the 2 weeks post procedure, please call and report this information to [place].    If any biopsies were taken you will be contacted by phone or by letter within the next 1-3 weeks.  Please call us at [phone] if you have not heard about the biopsies in 3 weeks.    SIGNATURES/CONFIDENTIALITY: You and/or your care partner have signed paperwork which will be entered into your electronic medical record.  These signatures attest to the fact that that the information above on your After Visit Summary has been reviewed and is understood.  Full responsibility of the confidentiality  of this discharge information lies with you and/or your care-partner.

## 2020-02-12 NOTE — Progress Notes (Signed)
pt tolerated well. VSS. awake and to recovery. Report given to RN.  

## 2020-02-13 DIAGNOSIS — J3081 Allergic rhinitis due to animal (cat) (dog) hair and dander: Secondary | ICD-10-CM | POA: Diagnosis not present

## 2020-02-13 DIAGNOSIS — J301 Allergic rhinitis due to pollen: Secondary | ICD-10-CM | POA: Diagnosis not present

## 2020-02-13 DIAGNOSIS — J3089 Other allergic rhinitis: Secondary | ICD-10-CM | POA: Diagnosis not present

## 2020-02-14 ENCOUNTER — Telehealth: Payer: Self-pay | Admitting: *Deleted

## 2020-02-14 NOTE — Telephone Encounter (Signed)
1. Have you developed a fever since your procedure? No  2.   Have you had an respiratory symptoms (SOB or cough) since your procedure? No   3.   Have you tested positive for COVID 19 since your procedure No  4.   Have you had any family members/close contacts diagnosed with the COVID 19 since your procedure?  No   If yes to any of these questions please route to Joylene John, RN and Joella Prince, RN Follow up Call-  Call back number 02/12/2020  Post procedure Call Back phone  # [phone]  Permission to leave phone message Yes  Some recent data might be hidden     Patient questions:  Do you have a fever, pain , or abdominal swelling? No. Pain Score  0 *  Have you tolerated food without any problems? Yes.    Have you been able to return to your normal activities? Yes.    Do you have any questions about your discharge instructions: Diet   No Medications  No. Follow up visit  No.  Do you have questions or concerns about your Care? No.  Actions: * If pain score is 4 or above: No action needed, pain <4.

## 2020-02-15 DIAGNOSIS — J3081 Allergic rhinitis due to animal (cat) (dog) hair and dander: Secondary | ICD-10-CM | POA: Diagnosis not present

## 2020-02-15 DIAGNOSIS — J3089 Other allergic rhinitis: Secondary | ICD-10-CM | POA: Diagnosis not present

## 2020-02-15 DIAGNOSIS — J301 Allergic rhinitis due to pollen: Secondary | ICD-10-CM | POA: Diagnosis not present

## 2020-02-18 ENCOUNTER — Encounter: Payer: Self-pay | Admitting: Gastroenterology

## 2020-02-20 DIAGNOSIS — J3089 Other allergic rhinitis: Secondary | ICD-10-CM | POA: Diagnosis not present

## 2020-02-20 DIAGNOSIS — J301 Allergic rhinitis due to pollen: Secondary | ICD-10-CM | POA: Diagnosis not present

## 2020-02-20 DIAGNOSIS — J3081 Allergic rhinitis due to animal (cat) (dog) hair and dander: Secondary | ICD-10-CM | POA: Diagnosis not present

## 2020-02-22 DIAGNOSIS — J301 Allergic rhinitis due to pollen: Secondary | ICD-10-CM | POA: Diagnosis not present

## 2020-02-22 DIAGNOSIS — J3081 Allergic rhinitis due to animal (cat) (dog) hair and dander: Secondary | ICD-10-CM | POA: Diagnosis not present

## 2020-02-22 DIAGNOSIS — J3089 Other allergic rhinitis: Secondary | ICD-10-CM | POA: Diagnosis not present

## 2020-02-25 DIAGNOSIS — J301 Allergic rhinitis due to pollen: Secondary | ICD-10-CM | POA: Diagnosis not present

## 2020-02-25 DIAGNOSIS — J3081 Allergic rhinitis due to animal (cat) (dog) hair and dander: Secondary | ICD-10-CM | POA: Diagnosis not present

## 2020-02-25 DIAGNOSIS — J3089 Other allergic rhinitis: Secondary | ICD-10-CM | POA: Diagnosis not present

## 2020-02-26 DIAGNOSIS — Z6827 Body mass index (BMI) 27.0-27.9, adult: Secondary | ICD-10-CM | POA: Diagnosis not present

## 2020-02-26 DIAGNOSIS — I10 Essential (primary) hypertension: Secondary | ICD-10-CM | POA: Diagnosis not present

## 2020-02-26 DIAGNOSIS — E1165 Type 2 diabetes mellitus with hyperglycemia: Secondary | ICD-10-CM | POA: Diagnosis not present

## 2020-02-26 DIAGNOSIS — E782 Mixed hyperlipidemia: Secondary | ICD-10-CM | POA: Diagnosis not present

## 2020-02-27 DIAGNOSIS — J3081 Allergic rhinitis due to animal (cat) (dog) hair and dander: Secondary | ICD-10-CM | POA: Diagnosis not present

## 2020-02-27 DIAGNOSIS — J301 Allergic rhinitis due to pollen: Secondary | ICD-10-CM | POA: Diagnosis not present

## 2020-02-27 DIAGNOSIS — J3089 Other allergic rhinitis: Secondary | ICD-10-CM | POA: Diagnosis not present

## 2020-03-03 DIAGNOSIS — J3089 Other allergic rhinitis: Secondary | ICD-10-CM | POA: Diagnosis not present

## 2020-03-03 DIAGNOSIS — J3081 Allergic rhinitis due to animal (cat) (dog) hair and dander: Secondary | ICD-10-CM | POA: Diagnosis not present

## 2020-03-03 DIAGNOSIS — J301 Allergic rhinitis due to pollen: Secondary | ICD-10-CM | POA: Diagnosis not present

## 2020-03-05 DIAGNOSIS — J3089 Other allergic rhinitis: Secondary | ICD-10-CM | POA: Diagnosis not present

## 2020-03-05 DIAGNOSIS — J301 Allergic rhinitis due to pollen: Secondary | ICD-10-CM | POA: Diagnosis not present

## 2020-03-05 DIAGNOSIS — J3081 Allergic rhinitis due to animal (cat) (dog) hair and dander: Secondary | ICD-10-CM | POA: Diagnosis not present

## 2020-03-07 DIAGNOSIS — Z23 Encounter for immunization: Secondary | ICD-10-CM | POA: Diagnosis not present

## 2020-03-10 DIAGNOSIS — J301 Allergic rhinitis due to pollen: Secondary | ICD-10-CM | POA: Diagnosis not present

## 2020-03-10 DIAGNOSIS — J3089 Other allergic rhinitis: Secondary | ICD-10-CM | POA: Diagnosis not present

## 2020-03-10 DIAGNOSIS — J3081 Allergic rhinitis due to animal (cat) (dog) hair and dander: Secondary | ICD-10-CM | POA: Diagnosis not present

## 2020-03-14 DIAGNOSIS — J3089 Other allergic rhinitis: Secondary | ICD-10-CM | POA: Diagnosis not present

## 2020-03-14 DIAGNOSIS — J301 Allergic rhinitis due to pollen: Secondary | ICD-10-CM | POA: Diagnosis not present

## 2020-03-14 DIAGNOSIS — J3081 Allergic rhinitis due to animal (cat) (dog) hair and dander: Secondary | ICD-10-CM | POA: Diagnosis not present

## 2020-03-19 DIAGNOSIS — J3089 Other allergic rhinitis: Secondary | ICD-10-CM | POA: Diagnosis not present

## 2020-03-19 DIAGNOSIS — J3081 Allergic rhinitis due to animal (cat) (dog) hair and dander: Secondary | ICD-10-CM | POA: Diagnosis not present

## 2020-03-19 DIAGNOSIS — J301 Allergic rhinitis due to pollen: Secondary | ICD-10-CM | POA: Diagnosis not present

## 2020-03-21 DIAGNOSIS — J301 Allergic rhinitis due to pollen: Secondary | ICD-10-CM | POA: Diagnosis not present

## 2020-03-21 DIAGNOSIS — J3089 Other allergic rhinitis: Secondary | ICD-10-CM | POA: Diagnosis not present

## 2020-03-21 DIAGNOSIS — J3081 Allergic rhinitis due to animal (cat) (dog) hair and dander: Secondary | ICD-10-CM | POA: Diagnosis not present

## 2020-03-25 DIAGNOSIS — J3089 Other allergic rhinitis: Secondary | ICD-10-CM | POA: Diagnosis not present

## 2020-03-25 DIAGNOSIS — J301 Allergic rhinitis due to pollen: Secondary | ICD-10-CM | POA: Diagnosis not present

## 2020-03-25 DIAGNOSIS — J3081 Allergic rhinitis due to animal (cat) (dog) hair and dander: Secondary | ICD-10-CM | POA: Diagnosis not present

## 2020-03-27 DIAGNOSIS — J301 Allergic rhinitis due to pollen: Secondary | ICD-10-CM | POA: Diagnosis not present

## 2020-03-27 DIAGNOSIS — J3089 Other allergic rhinitis: Secondary | ICD-10-CM | POA: Diagnosis not present

## 2020-03-27 DIAGNOSIS — Z7984 Long term (current) use of oral hypoglycemic drugs: Secondary | ICD-10-CM | POA: Diagnosis not present

## 2020-03-27 DIAGNOSIS — Z6827 Body mass index (BMI) 27.0-27.9, adult: Secondary | ICD-10-CM | POA: Diagnosis not present

## 2020-03-27 DIAGNOSIS — E119 Type 2 diabetes mellitus without complications: Secondary | ICD-10-CM | POA: Diagnosis not present

## 2020-03-27 DIAGNOSIS — H5213 Myopia, bilateral: Secondary | ICD-10-CM | POA: Diagnosis not present

## 2020-03-27 DIAGNOSIS — I10 Essential (primary) hypertension: Secondary | ICD-10-CM | POA: Diagnosis not present

## 2020-03-27 DIAGNOSIS — E782 Mixed hyperlipidemia: Secondary | ICD-10-CM | POA: Diagnosis not present

## 2020-03-27 DIAGNOSIS — E1165 Type 2 diabetes mellitus with hyperglycemia: Secondary | ICD-10-CM | POA: Diagnosis not present

## 2020-03-27 DIAGNOSIS — J3081 Allergic rhinitis due to animal (cat) (dog) hair and dander: Secondary | ICD-10-CM | POA: Diagnosis not present

## 2020-03-27 DIAGNOSIS — H52203 Unspecified astigmatism, bilateral: Secondary | ICD-10-CM | POA: Diagnosis not present

## 2020-03-27 DIAGNOSIS — H524 Presbyopia: Secondary | ICD-10-CM | POA: Diagnosis not present

## 2020-04-01 DIAGNOSIS — J3081 Allergic rhinitis due to animal (cat) (dog) hair and dander: Secondary | ICD-10-CM | POA: Diagnosis not present

## 2020-04-01 DIAGNOSIS — J3089 Other allergic rhinitis: Secondary | ICD-10-CM | POA: Diagnosis not present

## 2020-04-01 DIAGNOSIS — J301 Allergic rhinitis due to pollen: Secondary | ICD-10-CM | POA: Diagnosis not present

## 2020-04-03 DIAGNOSIS — E1165 Type 2 diabetes mellitus with hyperglycemia: Secondary | ICD-10-CM | POA: Diagnosis not present

## 2020-04-04 DIAGNOSIS — E782 Mixed hyperlipidemia: Secondary | ICD-10-CM | POA: Diagnosis not present

## 2020-04-04 DIAGNOSIS — J3089 Other allergic rhinitis: Secondary | ICD-10-CM | POA: Diagnosis not present

## 2020-04-04 DIAGNOSIS — J3081 Allergic rhinitis due to animal (cat) (dog) hair and dander: Secondary | ICD-10-CM | POA: Diagnosis not present

## 2020-04-04 DIAGNOSIS — I10 Essential (primary) hypertension: Secondary | ICD-10-CM | POA: Diagnosis not present

## 2020-04-04 DIAGNOSIS — E1169 Type 2 diabetes mellitus with other specified complication: Secondary | ICD-10-CM | POA: Diagnosis not present

## 2020-04-04 DIAGNOSIS — J301 Allergic rhinitis due to pollen: Secondary | ICD-10-CM | POA: Diagnosis not present

## 2020-04-08 DIAGNOSIS — H1045 Other chronic allergic conjunctivitis: Secondary | ICD-10-CM | POA: Diagnosis not present

## 2020-04-08 DIAGNOSIS — J4530 Mild persistent asthma, uncomplicated: Secondary | ICD-10-CM | POA: Diagnosis not present

## 2020-04-08 DIAGNOSIS — E782 Mixed hyperlipidemia: Secondary | ICD-10-CM | POA: Diagnosis not present

## 2020-04-08 DIAGNOSIS — J3081 Allergic rhinitis due to animal (cat) (dog) hair and dander: Secondary | ICD-10-CM | POA: Diagnosis not present

## 2020-04-08 DIAGNOSIS — E1165 Type 2 diabetes mellitus with hyperglycemia: Secondary | ICD-10-CM | POA: Diagnosis not present

## 2020-04-08 DIAGNOSIS — J301 Allergic rhinitis due to pollen: Secondary | ICD-10-CM | POA: Diagnosis not present

## 2020-04-08 DIAGNOSIS — I152 Hypertension secondary to endocrine disorders: Secondary | ICD-10-CM | POA: Diagnosis not present

## 2020-04-08 DIAGNOSIS — J3089 Other allergic rhinitis: Secondary | ICD-10-CM | POA: Diagnosis not present

## 2020-04-08 DIAGNOSIS — R7989 Other specified abnormal findings of blood chemistry: Secondary | ICD-10-CM | POA: Diagnosis not present

## 2020-04-11 DIAGNOSIS — G4733 Obstructive sleep apnea (adult) (pediatric): Secondary | ICD-10-CM | POA: Diagnosis not present

## 2020-04-16 DIAGNOSIS — J3081 Allergic rhinitis due to animal (cat) (dog) hair and dander: Secondary | ICD-10-CM | POA: Diagnosis not present

## 2020-04-16 DIAGNOSIS — J301 Allergic rhinitis due to pollen: Secondary | ICD-10-CM | POA: Diagnosis not present

## 2020-04-16 DIAGNOSIS — J3089 Other allergic rhinitis: Secondary | ICD-10-CM | POA: Diagnosis not present

## 2020-04-18 DIAGNOSIS — J3081 Allergic rhinitis due to animal (cat) (dog) hair and dander: Secondary | ICD-10-CM | POA: Diagnosis not present

## 2020-04-18 DIAGNOSIS — E1165 Type 2 diabetes mellitus with hyperglycemia: Secondary | ICD-10-CM | POA: Diagnosis not present

## 2020-04-18 DIAGNOSIS — J3089 Other allergic rhinitis: Secondary | ICD-10-CM | POA: Diagnosis not present

## 2020-04-23 DIAGNOSIS — J301 Allergic rhinitis due to pollen: Secondary | ICD-10-CM | POA: Diagnosis not present

## 2020-04-23 DIAGNOSIS — J3081 Allergic rhinitis due to animal (cat) (dog) hair and dander: Secondary | ICD-10-CM | POA: Diagnosis not present

## 2020-04-23 DIAGNOSIS — J3089 Other allergic rhinitis: Secondary | ICD-10-CM | POA: Diagnosis not present

## 2020-04-30 DIAGNOSIS — J3081 Allergic rhinitis due to animal (cat) (dog) hair and dander: Secondary | ICD-10-CM | POA: Diagnosis not present

## 2020-04-30 DIAGNOSIS — J3089 Other allergic rhinitis: Secondary | ICD-10-CM | POA: Diagnosis not present

## 2020-04-30 DIAGNOSIS — J301 Allergic rhinitis due to pollen: Secondary | ICD-10-CM | POA: Diagnosis not present

## 2020-05-05 DIAGNOSIS — E1165 Type 2 diabetes mellitus with hyperglycemia: Secondary | ICD-10-CM | POA: Diagnosis not present

## 2020-05-05 DIAGNOSIS — J301 Allergic rhinitis due to pollen: Secondary | ICD-10-CM | POA: Diagnosis not present

## 2020-05-06 DIAGNOSIS — J3081 Allergic rhinitis due to animal (cat) (dog) hair and dander: Secondary | ICD-10-CM | POA: Diagnosis not present

## 2020-05-06 DIAGNOSIS — J3089 Other allergic rhinitis: Secondary | ICD-10-CM | POA: Diagnosis not present

## 2020-05-07 DIAGNOSIS — J3089 Other allergic rhinitis: Secondary | ICD-10-CM | POA: Diagnosis not present

## 2020-05-07 DIAGNOSIS — J3081 Allergic rhinitis due to animal (cat) (dog) hair and dander: Secondary | ICD-10-CM | POA: Diagnosis not present

## 2020-05-07 DIAGNOSIS — J301 Allergic rhinitis due to pollen: Secondary | ICD-10-CM | POA: Diagnosis not present

## 2020-05-09 DIAGNOSIS — E559 Vitamin D deficiency, unspecified: Secondary | ICD-10-CM | POA: Diagnosis not present

## 2020-05-09 DIAGNOSIS — R7989 Other specified abnormal findings of blood chemistry: Secondary | ICD-10-CM | POA: Diagnosis not present

## 2020-05-09 DIAGNOSIS — E1165 Type 2 diabetes mellitus with hyperglycemia: Secondary | ICD-10-CM | POA: Diagnosis not present

## 2020-05-09 DIAGNOSIS — E782 Mixed hyperlipidemia: Secondary | ICD-10-CM | POA: Diagnosis not present

## 2020-05-13 DIAGNOSIS — E1169 Type 2 diabetes mellitus with other specified complication: Secondary | ICD-10-CM | POA: Diagnosis not present

## 2020-05-13 DIAGNOSIS — E782 Mixed hyperlipidemia: Secondary | ICD-10-CM | POA: Diagnosis not present

## 2020-05-13 DIAGNOSIS — I10 Essential (primary) hypertension: Secondary | ICD-10-CM | POA: Diagnosis not present

## 2020-05-13 DIAGNOSIS — E1165 Type 2 diabetes mellitus with hyperglycemia: Secondary | ICD-10-CM | POA: Diagnosis not present

## 2020-05-15 DIAGNOSIS — J3089 Other allergic rhinitis: Secondary | ICD-10-CM | POA: Diagnosis not present

## 2020-05-15 DIAGNOSIS — J301 Allergic rhinitis due to pollen: Secondary | ICD-10-CM | POA: Diagnosis not present

## 2020-05-15 DIAGNOSIS — J3081 Allergic rhinitis due to animal (cat) (dog) hair and dander: Secondary | ICD-10-CM | POA: Diagnosis not present

## 2020-05-21 DIAGNOSIS — J3081 Allergic rhinitis due to animal (cat) (dog) hair and dander: Secondary | ICD-10-CM | POA: Diagnosis not present

## 2020-05-21 DIAGNOSIS — J301 Allergic rhinitis due to pollen: Secondary | ICD-10-CM | POA: Diagnosis not present

## 2020-05-21 DIAGNOSIS — J3089 Other allergic rhinitis: Secondary | ICD-10-CM | POA: Diagnosis not present

## 2020-05-26 DIAGNOSIS — J3089 Other allergic rhinitis: Secondary | ICD-10-CM | POA: Diagnosis not present

## 2020-05-26 DIAGNOSIS — J301 Allergic rhinitis due to pollen: Secondary | ICD-10-CM | POA: Diagnosis not present

## 2020-05-26 DIAGNOSIS — J3081 Allergic rhinitis due to animal (cat) (dog) hair and dander: Secondary | ICD-10-CM | POA: Diagnosis not present

## 2020-05-28 DIAGNOSIS — J3089 Other allergic rhinitis: Secondary | ICD-10-CM | POA: Diagnosis not present

## 2020-05-28 DIAGNOSIS — J301 Allergic rhinitis due to pollen: Secondary | ICD-10-CM | POA: Diagnosis not present

## 2020-05-28 DIAGNOSIS — J3081 Allergic rhinitis due to animal (cat) (dog) hair and dander: Secondary | ICD-10-CM | POA: Diagnosis not present

## 2020-06-03 DIAGNOSIS — J301 Allergic rhinitis due to pollen: Secondary | ICD-10-CM | POA: Diagnosis not present

## 2020-06-03 DIAGNOSIS — J3081 Allergic rhinitis due to animal (cat) (dog) hair and dander: Secondary | ICD-10-CM | POA: Diagnosis not present

## 2020-06-03 DIAGNOSIS — J3089 Other allergic rhinitis: Secondary | ICD-10-CM | POA: Diagnosis not present

## 2020-06-05 DIAGNOSIS — J3089 Other allergic rhinitis: Secondary | ICD-10-CM | POA: Diagnosis not present

## 2020-06-05 DIAGNOSIS — J3081 Allergic rhinitis due to animal (cat) (dog) hair and dander: Secondary | ICD-10-CM | POA: Diagnosis not present

## 2020-06-05 DIAGNOSIS — J301 Allergic rhinitis due to pollen: Secondary | ICD-10-CM | POA: Diagnosis not present

## 2020-06-12 DIAGNOSIS — J3089 Other allergic rhinitis: Secondary | ICD-10-CM | POA: Diagnosis not present

## 2020-06-12 DIAGNOSIS — J301 Allergic rhinitis due to pollen: Secondary | ICD-10-CM | POA: Diagnosis not present

## 2020-06-12 DIAGNOSIS — J3081 Allergic rhinitis due to animal (cat) (dog) hair and dander: Secondary | ICD-10-CM | POA: Diagnosis not present

## 2020-06-18 DIAGNOSIS — J3081 Allergic rhinitis due to animal (cat) (dog) hair and dander: Secondary | ICD-10-CM | POA: Diagnosis not present

## 2020-06-18 DIAGNOSIS — J3089 Other allergic rhinitis: Secondary | ICD-10-CM | POA: Diagnosis not present

## 2020-06-18 DIAGNOSIS — J301 Allergic rhinitis due to pollen: Secondary | ICD-10-CM | POA: Diagnosis not present

## 2020-06-26 DIAGNOSIS — J301 Allergic rhinitis due to pollen: Secondary | ICD-10-CM | POA: Diagnosis not present

## 2020-06-26 DIAGNOSIS — J3089 Other allergic rhinitis: Secondary | ICD-10-CM | POA: Diagnosis not present

## 2020-06-26 DIAGNOSIS — J3081 Allergic rhinitis due to animal (cat) (dog) hair and dander: Secondary | ICD-10-CM | POA: Diagnosis not present

## 2020-07-03 DIAGNOSIS — J301 Allergic rhinitis due to pollen: Secondary | ICD-10-CM | POA: Diagnosis not present

## 2020-07-03 DIAGNOSIS — J3081 Allergic rhinitis due to animal (cat) (dog) hair and dander: Secondary | ICD-10-CM | POA: Diagnosis not present

## 2020-07-03 DIAGNOSIS — J3089 Other allergic rhinitis: Secondary | ICD-10-CM | POA: Diagnosis not present

## 2020-07-10 DIAGNOSIS — J3081 Allergic rhinitis due to animal (cat) (dog) hair and dander: Secondary | ICD-10-CM | POA: Diagnosis not present

## 2020-07-10 DIAGNOSIS — G4733 Obstructive sleep apnea (adult) (pediatric): Secondary | ICD-10-CM | POA: Diagnosis not present

## 2020-07-10 DIAGNOSIS — J3089 Other allergic rhinitis: Secondary | ICD-10-CM | POA: Diagnosis not present

## 2020-07-10 DIAGNOSIS — J301 Allergic rhinitis due to pollen: Secondary | ICD-10-CM | POA: Diagnosis not present

## 2020-07-17 DIAGNOSIS — J3089 Other allergic rhinitis: Secondary | ICD-10-CM | POA: Diagnosis not present

## 2020-07-17 DIAGNOSIS — J301 Allergic rhinitis due to pollen: Secondary | ICD-10-CM | POA: Diagnosis not present

## 2020-07-17 DIAGNOSIS — J3081 Allergic rhinitis due to animal (cat) (dog) hair and dander: Secondary | ICD-10-CM | POA: Diagnosis not present

## 2020-07-24 DIAGNOSIS — J3089 Other allergic rhinitis: Secondary | ICD-10-CM | POA: Diagnosis not present

## 2020-07-24 DIAGNOSIS — J3081 Allergic rhinitis due to animal (cat) (dog) hair and dander: Secondary | ICD-10-CM | POA: Diagnosis not present

## 2020-07-24 DIAGNOSIS — J301 Allergic rhinitis due to pollen: Secondary | ICD-10-CM | POA: Diagnosis not present

## 2020-07-31 DIAGNOSIS — J3089 Other allergic rhinitis: Secondary | ICD-10-CM | POA: Diagnosis not present

## 2020-07-31 DIAGNOSIS — J301 Allergic rhinitis due to pollen: Secondary | ICD-10-CM | POA: Diagnosis not present

## 2020-07-31 DIAGNOSIS — J3081 Allergic rhinitis due to animal (cat) (dog) hair and dander: Secondary | ICD-10-CM | POA: Diagnosis not present

## 2020-08-07 DIAGNOSIS — J3081 Allergic rhinitis due to animal (cat) (dog) hair and dander: Secondary | ICD-10-CM | POA: Diagnosis not present

## 2020-08-07 DIAGNOSIS — J301 Allergic rhinitis due to pollen: Secondary | ICD-10-CM | POA: Diagnosis not present

## 2020-08-07 DIAGNOSIS — J3089 Other allergic rhinitis: Secondary | ICD-10-CM | POA: Diagnosis not present

## 2020-08-08 DIAGNOSIS — E782 Mixed hyperlipidemia: Secondary | ICD-10-CM | POA: Diagnosis not present

## 2020-08-08 DIAGNOSIS — I10 Essential (primary) hypertension: Secondary | ICD-10-CM | POA: Diagnosis not present

## 2020-08-08 DIAGNOSIS — E559 Vitamin D deficiency, unspecified: Secondary | ICD-10-CM | POA: Diagnosis not present

## 2020-08-08 DIAGNOSIS — E1165 Type 2 diabetes mellitus with hyperglycemia: Secondary | ICD-10-CM | POA: Diagnosis not present

## 2020-08-12 DIAGNOSIS — E559 Vitamin D deficiency, unspecified: Secondary | ICD-10-CM | POA: Diagnosis not present

## 2020-08-12 DIAGNOSIS — E1165 Type 2 diabetes mellitus with hyperglycemia: Secondary | ICD-10-CM | POA: Diagnosis not present

## 2020-08-12 DIAGNOSIS — E782 Mixed hyperlipidemia: Secondary | ICD-10-CM | POA: Diagnosis not present

## 2020-08-15 DIAGNOSIS — J3089 Other allergic rhinitis: Secondary | ICD-10-CM | POA: Diagnosis not present

## 2020-08-15 DIAGNOSIS — J301 Allergic rhinitis due to pollen: Secondary | ICD-10-CM | POA: Diagnosis not present

## 2020-08-19 DIAGNOSIS — Z6827 Body mass index (BMI) 27.0-27.9, adult: Secondary | ICD-10-CM | POA: Diagnosis not present

## 2020-08-19 DIAGNOSIS — I10 Essential (primary) hypertension: Secondary | ICD-10-CM | POA: Diagnosis not present

## 2020-08-19 DIAGNOSIS — E1165 Type 2 diabetes mellitus with hyperglycemia: Secondary | ICD-10-CM | POA: Diagnosis not present

## 2020-08-19 DIAGNOSIS — E782 Mixed hyperlipidemia: Secondary | ICD-10-CM | POA: Diagnosis not present

## 2020-08-21 DIAGNOSIS — J3089 Other allergic rhinitis: Secondary | ICD-10-CM | POA: Diagnosis not present

## 2020-08-21 DIAGNOSIS — J3081 Allergic rhinitis due to animal (cat) (dog) hair and dander: Secondary | ICD-10-CM | POA: Diagnosis not present

## 2020-08-21 DIAGNOSIS — J301 Allergic rhinitis due to pollen: Secondary | ICD-10-CM | POA: Diagnosis not present

## 2020-08-26 ENCOUNTER — Encounter: Payer: Self-pay | Admitting: Adult Health

## 2020-08-26 ENCOUNTER — Ambulatory Visit (INDEPENDENT_AMBULATORY_CARE_PROVIDER_SITE_OTHER): Payer: BC Managed Care – PPO | Admitting: Adult Health

## 2020-08-26 VITALS — BP 106/66 | HR 79 | Ht 68.0 in | Wt 240.0 lb

## 2020-08-26 DIAGNOSIS — Z9989 Dependence on other enabling machines and devices: Secondary | ICD-10-CM

## 2020-08-26 DIAGNOSIS — G4733 Obstructive sleep apnea (adult) (pediatric): Secondary | ICD-10-CM

## 2020-08-26 NOTE — Progress Notes (Addendum)
PATIENT: Edwin Nelson DOB: 11-01-1970  REASON FOR VISIT: follow up HISTORY FROM: patient  HISTORY OF PRESENT ILLNESS: Today 08/26/20:  Edwin Nelson is a 50 year old male with a history of obstructive sleep apnea on CPAP.  He returns today for follow-up.  He reports that the CPAP is working well for him.  He denies any new issues.  His download is below.    08/23/19: Edwin Nelson is a 50 year old male with a history of obstructive sleep apnea on CPAP.  His download indicates that he uses machine nightly for compliance of 100%.  He uses machine greater than 4 hours each night.  On average he uses his machine 8 hours and 9 minutes.  His residual AHI is 1.6 on 17 cm of water with EPR of 3.  Leak in the 95th percentile is 1.8.  Reports CPAP is working well for him.  HISTORY 12/19/2018: I reviewed his AutoPap compliance data from 11/18/2018 through 12/17/2018 which is a total of 30 days, during which time he used his AutoPap 27 days with percent used days greater than 4 hours at 90%, indicating excellent compliance with an average usage of 8 hours and 5 minutes, residual AHI at goal at 1.9/h, 95th percentile of pressure at 12.6 cm with a range of 7 cm to 15 cm with EPR, leak on the low side with a 95th percentile at 2.5 L/min.  Set up date was 10/02/2018.  He reports that he has adapted well to AutoPap therapy and indicates good results including more restful sleep, less disruption, less tossing and turning and his snoring is minimal to none.  He skipped 2 days because he was out of town, had taken his AutoPap machine with him but had not taken the adapter for the hose.  He uses supplemental oxygen based on his abnormal pulse oximetry test results.  He is wondering if he needs any additional oxygen at this time once he changes to the CPAP, he is willing to try CPAP of 17 cm.  Once he started using AutoPap his mouth dryness improved because of the humidity added.  His weight has been stable.  He works from home most  of the time.  He is highly motivated to continue with treatment.  REVIEW OF SYSTEMS: Out of a complete 14 system review of symptoms, the patient complains only of the following symptoms, and all other reviewed systems are negative.  ESS 0  FSS 17  ALLERGIES: No Known Allergies  HOME MEDICATIONS: Outpatient Medications Prior to Visit  Medication Sig Dispense Refill  . amLODipine (NORVASC) 2.5 MG tablet Take 2.5 mg by mouth daily.    . ASPIRIN PO Take by mouth.    . Blood Glucose Monitoring Suppl (ONE TOUCH ULTRA SYSTEM KIT) W/DEVICE KIT 1 kit by Does not apply route once. 1 each 0  . Cholecalciferol (VITAMIN D3 PO) Take by mouth.    . EPINEPHrine 0.3 mg/0.3 mL IJ SOAJ injection as needed.    . fluticasone (FLONASE) 50 MCG/ACT nasal spray USE 1 SPRAY NASALLY TWICE A DAY 16 g 10  . glucose blood test strip Use up to twice daily, dx 250.00 100 each 3  . hydrochlorothiazide (HYDRODIURIL) 25 MG tablet Take 25 mg by mouth daily.    Vanessa Kick Ethyl (VASCEPA PO) Take by mouth.    . Lancets (ONETOUCH ULTRASOFT) lancets Use up to twice daily, dx 250.00 100 each 3  . levocetirizine (XYZAL) 5 MG tablet Take 5 mg by mouth daily.    Marland Kitchen  metFORMIN (GLUCOPHAGE) 500 MG tablet One half tablet prior to breakfast (Patient taking differently: 500 mg. One AND a half tablet prior to breakfast) 100 tablet 3  . metoprolol succinate (TOPROL-XL) 50 MG 24 hr tablet Take 50 mg by mouth daily.     . montelukast (SINGULAIR) 10 MG tablet Take 10 mg by mouth daily.    Marland Kitchen PROVENTIL HFA 108 (90 BASE) MCG/ACT inhaler INHALE 2 PUFFS THREE TIMES A DAY AS NEEDED 18 g 1  . QVAR 80 MCG/ACT inhaler INHALE 1 PUFF INTO THE LUNGS TWICE A DAY 3 g 3  . rosuvastatin (CRESTOR) 10 MG tablet Take 10 mg by mouth daily.    Marland Kitchen Spacer/Aero-Holding Chambers (AEROCHAMBER PLUS FLO-VU W/MASK) MISC     . valsartan (DIOVAN) 160 MG tablet Take 160 mg by mouth daily.    Marland Kitchen VASCEPA 1 g CAPS Take 1 g by mouth in the morning, at noon, in the evening,  and at bedtime.     . Cyanocobalamin (VITAMIN B-12 PO) Take by mouth.     No facility-administered medications prior to visit.    PAST MEDICAL HISTORY: Past Medical History:  Diagnosis Date  . Allergy    seasonal allergies  . Asthma    minor issue  . Bulging of thoracic intervertebral disc 2019   C4-6  . Diabetes mellitus without complication ([place])    on meds  . Hyperlipidemia    on meds  . Hypertension    on meds  . Sleep apnea    uses CPAP    PAST SURGICAL HISTORY: Past Surgical History:  Procedure Laterality Date  . COLONOSCOPY  02/12/2020  . GANGLION CYST EXCISION Right 2004  . TONSILLECTOMY AND ADENOIDECTOMY  1985  . WISDOM TOOTH EXTRACTION  1991    FAMILY HISTORY: Family History  Problem Relation Age of Onset  . Diabetes Father   . Heart disease Father   . Stroke Father   . Colon cancer Neg Hx   . Colon polyps Neg Hx   . Esophageal cancer Neg Hx   . Rectal cancer Neg Hx   . Stomach cancer Neg Hx     SOCIAL HISTORY: Social History   Socioeconomic History  . Marital status: Single    Spouse name: Not on file  . Number of children: Not on file  . Years of education: Not on file  . Highest education level: Not on file  Occupational History  . Not on file  Tobacco Use  . Smoking status: Former Smoker    Years: 10.00    Quit date: 2008    Years since quitting: 14.2  . Smokeless tobacco: Never Used  Vaping Use  . Vaping Use: Never used  Substance and Sexual Activity  . Alcohol use: Yes    Alcohol/week: 12.0 standard drinks    Types: 12 Cans of beer per week  . Drug use: No  . Sexual activity: Not on file  Other Topics Concern  . Not on file  Social History Narrative  . Not on file   Social Determinants of Health   Financial Resource Strain: Not on file  Food Insecurity: Not on file  Transportation Needs: Not on file  Physical Activity: Not on file  Stress: Not on file  Social Connections: Not on file  Intimate Partner Violence: Not  on file      PHYSICAL EXAM  Vitals:   08/26/20 0830  BP: 106/66  Pulse: 79  Weight: 240 lb (108.9 kg)  Height: 5'  8" (1.727 m)   Body mass index is 36.49 kg/m.  Generalized: Well developed, in no acute distress  Chest: Lungs clear to auscultation bilaterally  Neurological examination  Mentation: Alert oriented to time, place, history taking. Follows all commands speech and language fluent Cranial nerve II-XII: Extraocular movements were full, visual field were full on confrontational test Head turning and shoulder shrug  were normal and symmetric. Motor: The motor testing reveals 5 over 5 strength of all 4 extremities. Good symmetric motor tone is noted throughout.  Sensory: Sensory testing is intact to soft touch on all 4 extremities. No evidence of extinction is noted.  Gait and station: Gait is normal.    DIAGNOSTIC DATA (LABS, IMAGING, TESTING) - I reviewed patient records, labs, notes, testing and imaging myself where available.  Lab Results  Component Value Date   WBC 4.5 08/23/2012   HGB 14.8 08/23/2012   HCT 43.2 08/23/2012   MCV 90.7 08/23/2012   PLT 236.0 08/23/2012      Component Value Date/Time   NA 136 08/23/2012 0841   K 4.4 08/23/2012 0841   CL 100 08/23/2012 0841   CO2 25 08/23/2012 0841   GLUCOSE 129 (H) 08/23/2012 0841   BUN 14 08/23/2012 0841   CREATININE 1.0 08/23/2012 0841   CALCIUM 10.1 08/23/2012 0841   PROT 7.8 08/23/2012 0841   ALBUMIN 4.4 08/23/2012 0841   AST 51 (H) 08/23/2012 0841   ALT 75 (H) 08/23/2012 0841   ALKPHOS 67 08/23/2012 0841   BILITOT 0.6 08/23/2012 0841   GFRNONAA 100.62 05/22/2009 0000   GFRAA 109 04/22/2008 1010   Lab Results  Component Value Date   CHOL 185 08/23/2012   HDL 47.80 08/23/2012   LDLCALC 41 04/22/2008   LDLDIRECT 60.1 08/23/2012   TRIG (H) 08/23/2012    401.0 Triglyceride is over 400; calculations on Lipids are invalid.   CHOLHDL 4 08/23/2012   Lab Results  Component Value Date   HGBA1C 6.3  (A) 09/07/2012   No results found for: VITAMINB12 Lab Results  Component Value Date   TSH 2.47 08/23/2012      ASSESSMENT AND PLAN 50 y.o. year old male  has a past medical history of Allergy, Asthma, Bulging of thoracic intervertebral disc (2019), Diabetes mellitus without complication ([place]), Hyperlipidemia, Hypertension, and Sleep apnea. here with:  1. OSA on CPAP  - CPAP compliance excellent - Good treatment of AHI  - Encourage patient to use CPAP nightly and > 4 hours each night - F/U in 1 year or sooner if needed   I spent 20 minutes of face-to-face and non-face-to-face time with patient.  This included previsit chart review, lab review, study review, order entry, electronic health record documentation, patient education.  Ward Givens, MSN, NP-C 08/26/2020, 8:32 AM [facility] [address] [place], [place] [address] [phone]   I reviewed the above note and documentation by the Nurse Practitioner and agree with the history, exam, assessment and plan as outlined above. I was available for consultation. Star Age, MD, PhD [facility])

## 2020-08-26 NOTE — Patient Instructions (Signed)
Continue using CPAP nightly and greater than 4 hours each night °If your symptoms worsen or you develop new symptoms please let us know.  ° °

## 2020-08-28 DIAGNOSIS — J3081 Allergic rhinitis due to animal (cat) (dog) hair and dander: Secondary | ICD-10-CM | POA: Diagnosis not present

## 2020-08-28 DIAGNOSIS — J3089 Other allergic rhinitis: Secondary | ICD-10-CM | POA: Diagnosis not present

## 2020-08-28 DIAGNOSIS — J301 Allergic rhinitis due to pollen: Secondary | ICD-10-CM | POA: Diagnosis not present

## 2020-09-04 DIAGNOSIS — J301 Allergic rhinitis due to pollen: Secondary | ICD-10-CM | POA: Diagnosis not present

## 2020-09-04 DIAGNOSIS — J3081 Allergic rhinitis due to animal (cat) (dog) hair and dander: Secondary | ICD-10-CM | POA: Diagnosis not present

## 2020-09-04 DIAGNOSIS — J3089 Other allergic rhinitis: Secondary | ICD-10-CM | POA: Diagnosis not present

## 2020-09-11 DIAGNOSIS — J3081 Allergic rhinitis due to animal (cat) (dog) hair and dander: Secondary | ICD-10-CM | POA: Diagnosis not present

## 2020-09-11 DIAGNOSIS — J3089 Other allergic rhinitis: Secondary | ICD-10-CM | POA: Diagnosis not present

## 2020-09-11 DIAGNOSIS — J301 Allergic rhinitis due to pollen: Secondary | ICD-10-CM | POA: Diagnosis not present

## 2020-09-18 DIAGNOSIS — J3089 Other allergic rhinitis: Secondary | ICD-10-CM | POA: Diagnosis not present

## 2020-09-18 DIAGNOSIS — J3081 Allergic rhinitis due to animal (cat) (dog) hair and dander: Secondary | ICD-10-CM | POA: Diagnosis not present

## 2020-09-18 DIAGNOSIS — J301 Allergic rhinitis due to pollen: Secondary | ICD-10-CM | POA: Diagnosis not present

## 2020-09-25 DIAGNOSIS — J3089 Other allergic rhinitis: Secondary | ICD-10-CM | POA: Diagnosis not present

## 2020-09-25 DIAGNOSIS — J301 Allergic rhinitis due to pollen: Secondary | ICD-10-CM | POA: Diagnosis not present

## 2020-09-25 DIAGNOSIS — Z6827 Body mass index (BMI) 27.0-27.9, adult: Secondary | ICD-10-CM | POA: Diagnosis not present

## 2020-09-25 DIAGNOSIS — E782 Mixed hyperlipidemia: Secondary | ICD-10-CM | POA: Diagnosis not present

## 2020-09-25 DIAGNOSIS — E1165 Type 2 diabetes mellitus with hyperglycemia: Secondary | ICD-10-CM | POA: Diagnosis not present

## 2020-09-25 DIAGNOSIS — I10 Essential (primary) hypertension: Secondary | ICD-10-CM | POA: Diagnosis not present

## 2020-09-25 DIAGNOSIS — J3081 Allergic rhinitis due to animal (cat) (dog) hair and dander: Secondary | ICD-10-CM | POA: Diagnosis not present

## 2020-10-02 DIAGNOSIS — J3089 Other allergic rhinitis: Secondary | ICD-10-CM | POA: Diagnosis not present

## 2020-10-02 DIAGNOSIS — J301 Allergic rhinitis due to pollen: Secondary | ICD-10-CM | POA: Diagnosis not present

## 2020-10-02 DIAGNOSIS — J3081 Allergic rhinitis due to animal (cat) (dog) hair and dander: Secondary | ICD-10-CM | POA: Diagnosis not present

## 2020-10-07 DIAGNOSIS — J301 Allergic rhinitis due to pollen: Secondary | ICD-10-CM | POA: Diagnosis not present

## 2020-10-07 DIAGNOSIS — H1045 Other chronic allergic conjunctivitis: Secondary | ICD-10-CM | POA: Diagnosis not present

## 2020-10-07 DIAGNOSIS — J3089 Other allergic rhinitis: Secondary | ICD-10-CM | POA: Diagnosis not present

## 2020-10-07 DIAGNOSIS — J4530 Mild persistent asthma, uncomplicated: Secondary | ICD-10-CM | POA: Diagnosis not present

## 2020-10-07 DIAGNOSIS — J3081 Allergic rhinitis due to animal (cat) (dog) hair and dander: Secondary | ICD-10-CM | POA: Diagnosis not present

## 2020-10-09 DIAGNOSIS — G4733 Obstructive sleep apnea (adult) (pediatric): Secondary | ICD-10-CM | POA: Diagnosis not present

## 2020-10-10 DIAGNOSIS — J301 Allergic rhinitis due to pollen: Secondary | ICD-10-CM | POA: Diagnosis not present

## 2020-10-13 DIAGNOSIS — J3081 Allergic rhinitis due to animal (cat) (dog) hair and dander: Secondary | ICD-10-CM | POA: Diagnosis not present

## 2020-10-13 DIAGNOSIS — J3089 Other allergic rhinitis: Secondary | ICD-10-CM | POA: Diagnosis not present

## 2020-10-15 DIAGNOSIS — J3089 Other allergic rhinitis: Secondary | ICD-10-CM | POA: Diagnosis not present

## 2020-10-15 DIAGNOSIS — J301 Allergic rhinitis due to pollen: Secondary | ICD-10-CM | POA: Diagnosis not present

## 2020-10-23 DIAGNOSIS — J3089 Other allergic rhinitis: Secondary | ICD-10-CM | POA: Diagnosis not present

## 2020-10-23 DIAGNOSIS — J3081 Allergic rhinitis due to animal (cat) (dog) hair and dander: Secondary | ICD-10-CM | POA: Diagnosis not present

## 2020-10-23 DIAGNOSIS — J301 Allergic rhinitis due to pollen: Secondary | ICD-10-CM | POA: Diagnosis not present

## 2020-10-31 DIAGNOSIS — J3089 Other allergic rhinitis: Secondary | ICD-10-CM | POA: Diagnosis not present

## 2020-10-31 DIAGNOSIS — J301 Allergic rhinitis due to pollen: Secondary | ICD-10-CM | POA: Diagnosis not present

## 2020-10-31 DIAGNOSIS — J3081 Allergic rhinitis due to animal (cat) (dog) hair and dander: Secondary | ICD-10-CM | POA: Diagnosis not present

## 2020-11-06 DIAGNOSIS — J3081 Allergic rhinitis due to animal (cat) (dog) hair and dander: Secondary | ICD-10-CM | POA: Diagnosis not present

## 2020-11-06 DIAGNOSIS — J3089 Other allergic rhinitis: Secondary | ICD-10-CM | POA: Diagnosis not present

## 2020-11-06 DIAGNOSIS — J301 Allergic rhinitis due to pollen: Secondary | ICD-10-CM | POA: Diagnosis not present

## 2020-11-12 DIAGNOSIS — J3089 Other allergic rhinitis: Secondary | ICD-10-CM | POA: Diagnosis not present

## 2020-11-12 DIAGNOSIS — J3081 Allergic rhinitis due to animal (cat) (dog) hair and dander: Secondary | ICD-10-CM | POA: Diagnosis not present

## 2020-11-12 DIAGNOSIS — J301 Allergic rhinitis due to pollen: Secondary | ICD-10-CM | POA: Diagnosis not present

## 2020-11-18 DIAGNOSIS — E782 Mixed hyperlipidemia: Secondary | ICD-10-CM | POA: Diagnosis not present

## 2020-11-18 DIAGNOSIS — E559 Vitamin D deficiency, unspecified: Secondary | ICD-10-CM | POA: Diagnosis not present

## 2020-11-18 DIAGNOSIS — E1165 Type 2 diabetes mellitus with hyperglycemia: Secondary | ICD-10-CM | POA: Diagnosis not present

## 2020-11-20 DIAGNOSIS — E782 Mixed hyperlipidemia: Secondary | ICD-10-CM | POA: Diagnosis not present

## 2020-11-20 DIAGNOSIS — J3081 Allergic rhinitis due to animal (cat) (dog) hair and dander: Secondary | ICD-10-CM | POA: Diagnosis not present

## 2020-11-20 DIAGNOSIS — J3089 Other allergic rhinitis: Secondary | ICD-10-CM | POA: Diagnosis not present

## 2020-11-20 DIAGNOSIS — J301 Allergic rhinitis due to pollen: Secondary | ICD-10-CM | POA: Diagnosis not present

## 2020-11-20 DIAGNOSIS — E559 Vitamin D deficiency, unspecified: Secondary | ICD-10-CM | POA: Diagnosis not present

## 2020-11-20 DIAGNOSIS — I10 Essential (primary) hypertension: Secondary | ICD-10-CM | POA: Diagnosis not present

## 2020-11-20 DIAGNOSIS — E1165 Type 2 diabetes mellitus with hyperglycemia: Secondary | ICD-10-CM | POA: Diagnosis not present

## 2020-11-27 DIAGNOSIS — J3089 Other allergic rhinitis: Secondary | ICD-10-CM | POA: Diagnosis not present

## 2020-11-27 DIAGNOSIS — J3081 Allergic rhinitis due to animal (cat) (dog) hair and dander: Secondary | ICD-10-CM | POA: Diagnosis not present

## 2020-11-27 DIAGNOSIS — J301 Allergic rhinitis due to pollen: Secondary | ICD-10-CM | POA: Diagnosis not present

## 2020-12-02 DIAGNOSIS — J301 Allergic rhinitis due to pollen: Secondary | ICD-10-CM | POA: Diagnosis not present

## 2020-12-02 DIAGNOSIS — J3089 Other allergic rhinitis: Secondary | ICD-10-CM | POA: Diagnosis not present

## 2020-12-02 DIAGNOSIS — J3081 Allergic rhinitis due to animal (cat) (dog) hair and dander: Secondary | ICD-10-CM | POA: Diagnosis not present

## 2020-12-04 DIAGNOSIS — J3089 Other allergic rhinitis: Secondary | ICD-10-CM | POA: Diagnosis not present

## 2020-12-04 DIAGNOSIS — J301 Allergic rhinitis due to pollen: Secondary | ICD-10-CM | POA: Diagnosis not present

## 2020-12-04 DIAGNOSIS — J3081 Allergic rhinitis due to animal (cat) (dog) hair and dander: Secondary | ICD-10-CM | POA: Diagnosis not present

## 2020-12-08 DIAGNOSIS — J3089 Other allergic rhinitis: Secondary | ICD-10-CM | POA: Diagnosis not present

## 2020-12-08 DIAGNOSIS — J301 Allergic rhinitis due to pollen: Secondary | ICD-10-CM | POA: Diagnosis not present

## 2020-12-08 DIAGNOSIS — J3081 Allergic rhinitis due to animal (cat) (dog) hair and dander: Secondary | ICD-10-CM | POA: Diagnosis not present

## 2020-12-16 DIAGNOSIS — J301 Allergic rhinitis due to pollen: Secondary | ICD-10-CM | POA: Diagnosis not present

## 2020-12-16 DIAGNOSIS — J3089 Other allergic rhinitis: Secondary | ICD-10-CM | POA: Diagnosis not present

## 2020-12-16 DIAGNOSIS — J3081 Allergic rhinitis due to animal (cat) (dog) hair and dander: Secondary | ICD-10-CM | POA: Diagnosis not present

## 2020-12-19 DIAGNOSIS — D509 Iron deficiency anemia, unspecified: Secondary | ICD-10-CM | POA: Diagnosis not present

## 2020-12-19 DIAGNOSIS — Z125 Encounter for screening for malignant neoplasm of prostate: Secondary | ICD-10-CM | POA: Diagnosis not present

## 2020-12-19 DIAGNOSIS — Z0000 Encounter for general adult medical examination without abnormal findings: Secondary | ICD-10-CM | POA: Diagnosis not present

## 2020-12-23 DIAGNOSIS — Z1211 Encounter for screening for malignant neoplasm of colon: Secondary | ICD-10-CM | POA: Diagnosis not present

## 2020-12-23 DIAGNOSIS — D61818 Other pancytopenia: Secondary | ICD-10-CM | POA: Diagnosis not present

## 2020-12-23 DIAGNOSIS — Z0000 Encounter for general adult medical examination without abnormal findings: Secondary | ICD-10-CM | POA: Diagnosis not present

## 2020-12-24 DIAGNOSIS — J3089 Other allergic rhinitis: Secondary | ICD-10-CM | POA: Diagnosis not present

## 2020-12-24 DIAGNOSIS — J3081 Allergic rhinitis due to animal (cat) (dog) hair and dander: Secondary | ICD-10-CM | POA: Diagnosis not present

## 2020-12-24 DIAGNOSIS — J301 Allergic rhinitis due to pollen: Secondary | ICD-10-CM | POA: Diagnosis not present

## 2020-12-26 ENCOUNTER — Telehealth: Payer: Self-pay | Admitting: Hematology and Oncology

## 2020-12-26 NOTE — Telephone Encounter (Signed)
Received a new hem referral from Dr. Ernie Hew for pancytopenia. Mr. Edwin Nelson returned my call and has been scheduled to see Dr. Alvy Bimler on 8/5 at 1pm. Pt aware to arrive 30 minutes early.

## 2020-12-31 ENCOUNTER — Encounter: Payer: Self-pay | Admitting: Hematology and Oncology

## 2020-12-31 DIAGNOSIS — D61818 Other pancytopenia: Secondary | ICD-10-CM | POA: Insufficient documentation

## 2020-12-31 DIAGNOSIS — J3081 Allergic rhinitis due to animal (cat) (dog) hair and dander: Secondary | ICD-10-CM | POA: Diagnosis not present

## 2020-12-31 DIAGNOSIS — J301 Allergic rhinitis due to pollen: Secondary | ICD-10-CM | POA: Diagnosis not present

## 2020-12-31 DIAGNOSIS — J3089 Other allergic rhinitis: Secondary | ICD-10-CM | POA: Diagnosis not present

## 2020-12-31 DIAGNOSIS — R748 Abnormal levels of other serum enzymes: Secondary | ICD-10-CM | POA: Insufficient documentation

## 2020-12-31 DIAGNOSIS — R7989 Other specified abnormal findings of blood chemistry: Secondary | ICD-10-CM | POA: Insufficient documentation

## 2021-01-02 ENCOUNTER — Encounter: Payer: Self-pay | Admitting: Hematology and Oncology

## 2021-01-02 ENCOUNTER — Inpatient Hospital Stay: Payer: BC Managed Care – PPO

## 2021-01-02 ENCOUNTER — Other Ambulatory Visit: Payer: Self-pay

## 2021-01-02 ENCOUNTER — Telehealth: Payer: Self-pay | Admitting: Oncology

## 2021-01-02 ENCOUNTER — Inpatient Hospital Stay: Payer: BC Managed Care – PPO | Attending: Hematology and Oncology | Admitting: Hematology and Oncology

## 2021-01-02 VITALS — BP 120/64 | HR 83 | Temp 97.8°F | Resp 18 | Ht 68.0 in | Wt 240.2 lb

## 2021-01-02 DIAGNOSIS — E119 Type 2 diabetes mellitus without complications: Secondary | ICD-10-CM

## 2021-01-02 DIAGNOSIS — Z87891 Personal history of nicotine dependence: Secondary | ICD-10-CM | POA: Diagnosis not present

## 2021-01-02 DIAGNOSIS — D72829 Elevated white blood cell count, unspecified: Secondary | ICD-10-CM

## 2021-01-02 DIAGNOSIS — D539 Nutritional anemia, unspecified: Secondary | ICD-10-CM

## 2021-01-02 DIAGNOSIS — D61818 Other pancytopenia: Secondary | ICD-10-CM | POA: Diagnosis not present

## 2021-01-02 DIAGNOSIS — I10 Essential (primary) hypertension: Secondary | ICD-10-CM | POA: Insufficient documentation

## 2021-01-02 DIAGNOSIS — R7989 Other specified abnormal findings of blood chemistry: Secondary | ICD-10-CM | POA: Diagnosis not present

## 2021-01-02 DIAGNOSIS — R161 Splenomegaly, not elsewhere classified: Secondary | ICD-10-CM

## 2021-01-02 DIAGNOSIS — R748 Abnormal levels of other serum enzymes: Secondary | ICD-10-CM

## 2021-01-02 DIAGNOSIS — D72819 Decreased white blood cell count, unspecified: Secondary | ICD-10-CM | POA: Diagnosis not present

## 2021-01-02 LAB — IRON AND TIBC
Iron: 81 ug/dL (ref 42–163)
Saturation Ratios: 16 % — ABNORMAL LOW (ref 20–55)
TIBC: 511 ug/dL — ABNORMAL HIGH (ref 202–409)
UIBC: 430 ug/dL — ABNORMAL HIGH (ref 117–376)

## 2021-01-02 LAB — COMPREHENSIVE METABOLIC PANEL
ALT: 84 U/L — ABNORMAL HIGH (ref 0–44)
AST: 57 U/L — ABNORMAL HIGH (ref 15–41)
Albumin: 4.4 g/dL (ref 3.5–5.0)
Alkaline Phosphatase: 26 U/L — ABNORMAL LOW (ref 38–126)
Anion gap: 11 (ref 5–15)
BUN: 27 mg/dL — ABNORMAL HIGH (ref 6–20)
CO2: 24 mmol/L (ref 22–32)
Calcium: 11.3 mg/dL — ABNORMAL HIGH (ref 8.9–10.3)
Chloride: 102 mmol/L (ref 98–111)
Creatinine, Ser: 1.24 mg/dL (ref 0.61–1.24)
GFR, Estimated: >60 mL/min (ref >60)
Glucose, Bld: 109 mg/dL — ABNORMAL HIGH (ref 70–99)
Potassium: 4.4 mmol/L (ref 3.5–5.1)
Sodium: 137 mmol/L (ref 135–145)
Total Bilirubin: 0.5 mg/dL (ref 0.3–1.2)
Total Protein: 8.1 g/dL (ref 6.5–8.1)

## 2021-01-02 LAB — SEDIMENTATION RATE: Sed Rate: 30 mm/hr — ABNORMAL HIGH (ref 0–16)

## 2021-01-02 LAB — CBC WITH DIFFERENTIAL/PLATELET
Abs Immature Granulocytes: 0.02 10*3/uL (ref 0.00–0.07)
Basophils Absolute: 0 10*3/uL (ref 0.0–0.1)
Basophils Relative: 1 %
Eosinophils Absolute: 0.1 10*3/uL (ref 0.0–0.5)
Eosinophils Relative: 1 %
HCT: 37.2 % — ABNORMAL LOW (ref 39.0–52.0)
Hemoglobin: 12.3 g/dL — ABNORMAL LOW (ref 13.0–17.0)
Immature Granulocytes: 1 %
Lymphocytes Relative: 40 %
Lymphs Abs: 1.6 10*3/uL (ref 0.7–4.0)
MCH: 31.4 pg (ref 26.0–34.0)
MCHC: 33.1 g/dL (ref 30.0–36.0)
MCV: 94.9 fL (ref 80.0–100.0)
Monocytes Absolute: 0.6 10*3/uL (ref 0.1–1.0)
Monocytes Relative: 14 %
Neutro Abs: 1.8 10*3/uL (ref 1.7–7.7)
Neutrophils Relative %: 43 %
Platelets: 184 10*3/uL (ref 150–400)
RBC: 3.92 MIL/uL — ABNORMAL LOW (ref 4.22–5.81)
RDW: 13.2 % (ref 11.5–15.5)
WBC: 4 10*3/uL (ref 4.0–10.5)
nRBC: 0 % (ref 0.0–0.2)

## 2021-01-02 LAB — HEPATITIS PANEL, ACUTE
HCV Ab: NONREACTIVE
Hep A IgM: NONREACTIVE
Hep B C IgM: NONREACTIVE
Hepatitis B Surface Ag: NONREACTIVE

## 2021-01-02 LAB — VITAMIN B12: Vitamin B-12: 240 pg/mL (ref 180–914)

## 2021-01-02 LAB — FERRITIN: Ferritin: 129 ng/mL (ref 24–336)

## 2021-01-02 MED ORDER — FUROSEMIDE 20 MG PO TABS: 20.0000 mg | ORAL_TABLET | Freq: Every day | ORAL | 0 refills | Status: AC

## 2021-01-02 NOTE — Telephone Encounter (Signed)
Called Edwin Nelson and advised him that his calcium level is elevated and to stop taking hydrochlorothiazide.  Also advised Dr. Alvy Bimler will send in a prescription for Lasix which may help reduce the calcium and leg sweeling.  He verbalized agreement and would like the prescription send to the [facility] on [address].  Also discussed that we are working to get his CT scan authorized by his insurance and will call to schedule a follow up with Dr. Alvy Bimler once we know when it is scheduled.  He said he was able to schedule it for 01/09/21.

## 2021-01-04 ENCOUNTER — Encounter: Payer: Self-pay | Admitting: Hematology and Oncology

## 2021-01-04 NOTE — Assessment & Plan Note (Addendum)
He has mild intermittent leukopenia, could be secondary to mild splenomegaly He had negative HIV screening I will order autoimmune screen and vitamin B12 He has abnormal liver enzymes He has signs of iron deficiency anemia I recommend CT imaging of the abdomen and pelvis for evaluation I might have to refer him back to gastroenterologist for upper endoscopy evaluation

## 2021-01-04 NOTE — Assessment & Plan Note (Signed)
He has elevated liver enzymes and signs of hypercalcemia I am concerned about malignancy I recommend CT imaging for evaluation

## 2021-01-04 NOTE — Assessment & Plan Note (Signed)
He has slight elevated serum creatinine We discussed importance of adequate fluid hydration

## 2021-01-04 NOTE — Assessment & Plan Note (Signed)
The hypercalcemia could be related to hydrochlorothiazide I recommend discontinuation of hydrochlorothiazide and substitute that with furosemide I recommend increase oral fluid hydration

## 2021-01-04 NOTE — Progress Notes (Signed)
[facility] NOTE  Patient Care Team: Fanny Bien, MD as PCP - General (Family Medicine)  CHIEF COMPLAINTS/PURPOSE OF CONSULTATION:  Pancytopenia  HISTORY OF PRESENTING ILLNESS:  Edwin Nelson 50 y.o. male is here because of recent findings of pancytopenia  I have the opportunity to review his blood count dated back all the way to May 22, 2009 At that time, he has normal CBC with white count of 6.9, hemoglobin 14.3 and platelet count 199 Since 2012, he was noted to have mild intermittent leukopenia with a white count ranging between 4.1-4.5 Most recently, on 12/25/2020, he had blood work done as part of his routine visit with his primary care doctor and was noted to have white blood cell count 3.6, hemoglobin 11.9 and platelet count 197 He denies recurrent infection  He was also noted to be mildly anemic  He denies recent chest pain on exertion, shortness of breath on minimal exertion, pre-syncopal episodes, or palpitations. He had not noticed any recent bleeding such as epistaxis, hematuria or hematochezia The patient denies over the counter NSAID ingestion. He is on antiplatelets agents. His last colonoscopy was on 02/12/2020 Results: - The perianal and digital rectal examinations were normal. - Three sessile polyps were found in the transverse colon and ascending colon. The polyps were 3 to 6 mm in size. These polyps were removed with a cold snare. Resection and retrieval were complete. - A 10 mm polyp was found in the descending colon. The polyp was flat. The polyp was removed with a cold snare. Resection and retrieval were complete. - The exam was otherwise without abnormality on direct and retroflexion views.  He had no prior history or diagnosis of cancer. His age appropriate screening programs are up-to-date. He denies any pica and eats a variety of diet. He never donated blood or received blood transfusion MEDICAL HISTORY:  Past Medical  History:  Diagnosis Date   Allergy    seasonal allergies   Asthma    minor issue   Bulging of thoracic intervertebral disc 2019   C4-6   Diabetes mellitus without complication ([place])    on meds   Hyperlipidemia    on meds   Hypertension    on meds   Sleep apnea    uses CPAP    SURGICAL HISTORY: Past Surgical History:  Procedure Laterality Date   COLONOSCOPY  02/12/2020   GANGLION CYST EXCISION Right 2004   TONSILLECTOMY AND ADENOIDECTOMY  1985   WISDOM TOOTH EXTRACTION  1991    SOCIAL HISTORY: Social History   Socioeconomic History   Marital status: Single    Spouse name: Not on file   Number of children: Not on file   Years of education: Not on file   Highest education level: Not on file  Occupational History   Not on file  Tobacco Use   Smoking status: Former    Years: 10.00    Types: Cigarettes    Quit date: 2008    Years since quitting: 14.6   Smokeless tobacco: Never  Vaping Use   Vaping Use: Never used  Substance and Sexual Activity   Alcohol use: Yes    Alcohol/week: 12.0 standard drinks    Types: 12 Cans of beer per week   Drug use: No   Sexual activity: Not on file  Other Topics Concern   Not on file  Social History Narrative   Not on file   Social Determinants of Health   Financial Resource  Strain: Not on file  Food Insecurity: Not on file  Transportation Needs: Not on file  Physical Activity: Not on file  Stress: Not on file  Social Connections: Not on file  Intimate Partner Violence: Not on file    FAMILY HISTORY: Family History  Problem Relation Age of Onset   Diabetes Father    Heart disease Father    Stroke Father    Colon cancer Neg Hx    Colon polyps Neg Hx    Esophageal cancer Neg Hx    Rectal cancer Neg Hx    Stomach cancer Neg Hx     ALLERGIES:  has No Known Allergies.  MEDICATIONS:  Current Outpatient Medications  Medication Sig Dispense Refill   ASPIRIN PO Take 81 mg by mouth daily.     Cholecalciferol  (VITAMIN D3 PO) Take 5,000 Units by mouth daily.     fenofibrate 160 MG tablet Take 160 mg by mouth daily.     metFORMIN (GLUCOPHAGE) 500 MG tablet Take by mouth once. Take 1000 mg morning and 500 mg in the evening     amLODipine (NORVASC) 2.5 MG tablet Take 2.5 mg by mouth daily.     Blood Glucose Monitoring Suppl (ONE TOUCH ULTRA SYSTEM KIT) W/DEVICE KIT 1 kit by Does not apply route once. 1 each 0   EPINEPHrine 0.3 mg/0.3 mL IJ SOAJ injection as needed.     fluticasone (FLONASE) 50 MCG/ACT nasal spray USE 1 SPRAY NASALLY TWICE A DAY 16 g 10   furosemide (LASIX) 20 MG tablet Take 1 tablet (20 mg total) by mouth daily. 30 tablet 0   glucose blood test strip Use up to twice daily, dx 250.00 100 each 3   Lancets (ONETOUCH ULTRASOFT) lancets Use up to twice daily, dx 250.00 100 each 3   levocetirizine (XYZAL) 5 MG tablet Take 5 mg by mouth daily.     metoprolol succinate (TOPROL-XL) 50 MG 24 hr tablet Take 50 mg by mouth daily.      montelukast (SINGULAIR) 10 MG tablet Take 10 mg by mouth daily.     PROVENTIL HFA 108 (90 BASE) MCG/ACT inhaler INHALE 2 PUFFS THREE TIMES A DAY AS NEEDED 18 g 1   QVAR 80 MCG/ACT inhaler INHALE 1 PUFF INTO THE LUNGS TWICE A DAY 3 g 3   rosuvastatin (CRESTOR) 20 MG tablet Take 20 mg by mouth daily.     Spacer/Aero-Holding Chambers (AEROCHAMBER PLUS FLO-VU W/MASK) MISC      valsartan (DIOVAN) 80 MG tablet Take 80 mg by mouth daily.     VASCEPA 1 g CAPS Take 1 g by mouth in the morning, at noon, in the evening, and at bedtime.      No current facility-administered medications for this visit.    REVIEW OF SYSTEMS:   Constitutional: Denies fevers, chills or abnormal night sweats Eyes: Denies blurriness of vision, double vision or watery eyes Ears, nose, mouth, throat, and face: Denies mucositis or sore throat Respiratory: Denies cough, dyspnea or wheezes Cardiovascular: Denies palpitation, chest discomfort or lower extremity swelling Gastrointestinal:  Denies  nausea, heartburn or change in bowel habits Skin: Denies abnormal skin rashes Lymphatics: Denies new lymphadenopathy or easy bruising Neurological:Denies numbness, tingling or new weaknesses Behavioral/Psych: Mood is stable, no new changes  All other systems were reviewed with the patient and are negative.  PHYSICAL EXAMINATION: ECOG PERFORMANCE STATUS: 0 - Asymptomatic  Vitals:   01/02/21 1241  BP: 120/64  Pulse: 83  Resp: 18  Temp: 97.8  F (36.6 C)  SpO2: 100%   Filed Weights   01/02/21 1241  Weight: 240 lb 3.2 oz (109 kg)    GENERAL:alert, no distress and comfortable.  Slight limitation of abdominal examination due to central obesity SKIN: skin color, texture, turgor are normal, no rashes or significant lesions EYES: normal, conjunctiva are pink and non-injected, sclera clear OROPHARYNX:no exudate, no erythema and lips, buccal mucosa, and tongue normal  NECK: supple, thyroid normal size, non-tender, without nodularity LYMPH:  no palpable lymphadenopathy in the cervical, axillary or inguinal LUNGS: clear to auscultation and percussion with normal breathing effort HEART: regular rate & rhythm and no murmurs and no lower extremity edema ABDOMEN:abdomen soft, non-tender and normal bowel sounds Musculoskeletal:no cyanosis of digits and no clubbing  PSYCH: alert & oriented x 3 with fluent speech NEURO: no focal motor/sensory deficits  ASSESSMENT & PLAN:  Pancytopenia, acquired ([place]) He has mild intermittent leukopenia, could be secondary to mild splenomegaly He had negative HIV screening I will order autoimmune screen and vitamin B12 He has abnormal liver enzymes He has signs of iron deficiency anemia I recommend CT imaging of the abdomen and pelvis for evaluation I might have to refer him back to gastroenterologist for upper endoscopy evaluation  Elevated liver enzymes He has elevated liver enzymes and signs of hypercalcemia I am concerned about malignancy I recommend  CT imaging for evaluation  Hypercalcemia The hypercalcemia could be related to hydrochlorothiazide I recommend discontinuation of hydrochlorothiazide and substitute that with furosemide I recommend increase oral fluid hydration  Elevated serum creatinine He has slight elevated serum creatinine We discussed importance of adequate fluid hydration  Orders Placed This Encounter  Procedures   CT ABDOMEN PELVIS W CONTRAST    Standing Status:   Future    Standing Expiration Date:   01/02/2022    Order Specific Question:   If indicated for the ordered procedure, I authorize the administration of contrast media per Radiology protocol    Answer:   Yes    Order Specific Question:   Preferred imaging location?    Answer:   [facility]    Order Specific Question:   Radiology Contrast Protocol - do NOT remove file path    Answer:   [email or web address]   CBC with Differential/Platelet    Standing Status:   Future    Number of Occurrences:   1    Standing Expiration Date:   01/02/2022   Comprehensive metabolic panel    Standing Status:   Future    Number of Occurrences:   1    Standing Expiration Date:   01/02/2022   Vitamin B12    Standing Status:   Future    Number of Occurrences:   1    Standing Expiration Date:   01/02/2022   Sedimentation rate    Standing Status:   Future    Number of Occurrences:   1    Standing Expiration Date:   01/02/2022   Iron and TIBC    Standing Status:   Future    Number of Occurrences:   1    Standing Expiration Date:   01/02/2022   Ferritin    Standing Status:   Future    Number of Occurrences:   1    Standing Expiration Date:   01/02/2022   ANA, IFA (with reflex)    Standing Status:   Future    Number of Occurrences:   1    Standing Expiration Date:   01/02/2022  Hepatitis panel, acute    Standing Status:   Future    Number of Occurrences:   1    Standing Expiration Date:   01/02/2022    All questions were  answered. The patient knows to call the clinic with any problems, questions or concerns.  The total time spent in the appointment was 55 minutes encounter with patients including review of chart and various tests results, discussions about plan of care and coordination of care plan  Heath Lark, MD 8/7/20221:07 PM       Heath Lark, MD 01/04/21 1:07 PM

## 2021-01-05 ENCOUNTER — Telehealth: Payer: Self-pay

## 2021-01-05 ENCOUNTER — Other Ambulatory Visit: Payer: Self-pay | Admitting: Hematology and Oncology

## 2021-01-05 DIAGNOSIS — R748 Abnormal levels of other serum enzymes: Secondary | ICD-10-CM

## 2021-01-05 DIAGNOSIS — R7989 Other specified abnormal findings of blood chemistry: Secondary | ICD-10-CM

## 2021-01-05 NOTE — Telephone Encounter (Signed)
-----   Message from Heath Lark, MD sent at 01/05/2021  8:33 AM EDT ----- I got his CT approved I recommend repeat labs after CT on Friday Please set up appt on Monday 8/15 to review CT and labs, 130 pm to 2 pm

## 2021-01-05 NOTE — Telephone Encounter (Signed)
Called and left a message asking him to call the office back. 

## 2021-01-06 NOTE — Telephone Encounter (Signed)
Called and reviewed all upcoming appts. He verbalized understanding.

## 2021-01-07 DIAGNOSIS — G4733 Obstructive sleep apnea (adult) (pediatric): Secondary | ICD-10-CM | POA: Diagnosis not present

## 2021-01-07 DIAGNOSIS — J301 Allergic rhinitis due to pollen: Secondary | ICD-10-CM | POA: Diagnosis not present

## 2021-01-07 DIAGNOSIS — J3089 Other allergic rhinitis: Secondary | ICD-10-CM | POA: Diagnosis not present

## 2021-01-07 DIAGNOSIS — J3081 Allergic rhinitis due to animal (cat) (dog) hair and dander: Secondary | ICD-10-CM | POA: Diagnosis not present

## 2021-01-08 LAB — ANTINUCLEAR ANTIBODIES, IFA: ANA Ab, IFA: NEGATIVE

## 2021-01-09 ENCOUNTER — Encounter (HOSPITAL_COMMUNITY): Payer: Self-pay

## 2021-01-09 ENCOUNTER — Other Ambulatory Visit: Payer: Self-pay

## 2021-01-09 ENCOUNTER — Ambulatory Visit (HOSPITAL_COMMUNITY)
Admission: RE | Admit: 2021-01-09 | Discharge: 2021-01-09 | Disposition: A | Discharge status: Home / Self Care | Payer: BC Managed Care – PPO | Source: Ambulatory Visit | Attending: Hematology and Oncology | Admitting: Hematology and Oncology

## 2021-01-09 ENCOUNTER — Inpatient Hospital Stay: Payer: BC Managed Care – PPO

## 2021-01-09 DIAGNOSIS — I10 Essential (primary) hypertension: Secondary | ICD-10-CM | POA: Diagnosis not present

## 2021-01-09 DIAGNOSIS — R748 Abnormal levels of other serum enzymes: Secondary | ICD-10-CM | POA: Insufficient documentation

## 2021-01-09 DIAGNOSIS — D61818 Other pancytopenia: Secondary | ICD-10-CM

## 2021-01-09 DIAGNOSIS — I700 Atherosclerosis of aorta: Secondary | ICD-10-CM | POA: Diagnosis not present

## 2021-01-09 DIAGNOSIS — D72819 Decreased white blood cell count, unspecified: Secondary | ICD-10-CM | POA: Diagnosis not present

## 2021-01-09 DIAGNOSIS — M47814 Spondylosis without myelopathy or radiculopathy, thoracic region: Secondary | ICD-10-CM | POA: Diagnosis not present

## 2021-01-09 DIAGNOSIS — R161 Splenomegaly, not elsewhere classified: Secondary | ICD-10-CM | POA: Diagnosis not present

## 2021-01-09 DIAGNOSIS — Z87891 Personal history of nicotine dependence: Secondary | ICD-10-CM | POA: Diagnosis not present

## 2021-01-09 DIAGNOSIS — E119 Type 2 diabetes mellitus without complications: Secondary | ICD-10-CM | POA: Diagnosis not present

## 2021-01-09 DIAGNOSIS — N281 Cyst of kidney, acquired: Secondary | ICD-10-CM | POA: Diagnosis not present

## 2021-01-09 DIAGNOSIS — R7989 Other specified abnormal findings of blood chemistry: Secondary | ICD-10-CM

## 2021-01-09 MED ORDER — IOHEXOL 350 MG/ML SOLN
100.0000 mL | Freq: Once | INTRAVENOUS | Status: AC | PRN
  Administered 2021-01-09: 80 mL via INTRAVENOUS

## 2021-01-10 LAB — PTH, INTACT AND CALCIUM
Calcium, Total (PTH): 10.8 mg/dL — ABNORMAL HIGH (ref 8.7–10.2)
PTH: 6 pg/mL — ABNORMAL LOW (ref 15–65)

## 2021-01-12 ENCOUNTER — Other Ambulatory Visit: Payer: Self-pay

## 2021-01-12 ENCOUNTER — Telehealth: Payer: Self-pay

## 2021-01-12 ENCOUNTER — Encounter: Payer: Self-pay | Admitting: Hematology and Oncology

## 2021-01-12 ENCOUNTER — Inpatient Hospital Stay: Payer: BC Managed Care – PPO

## 2021-01-12 ENCOUNTER — Inpatient Hospital Stay (HOSPITAL_BASED_OUTPATIENT_CLINIC_OR_DEPARTMENT_OTHER): Payer: BC Managed Care – PPO | Admitting: Hematology and Oncology

## 2021-01-12 DIAGNOSIS — D72819 Decreased white blood cell count, unspecified: Secondary | ICD-10-CM | POA: Diagnosis not present

## 2021-01-12 DIAGNOSIS — I10 Essential (primary) hypertension: Secondary | ICD-10-CM | POA: Diagnosis not present

## 2021-01-12 DIAGNOSIS — R7989 Other specified abnormal findings of blood chemistry: Secondary | ICD-10-CM | POA: Diagnosis not present

## 2021-01-12 DIAGNOSIS — E119 Type 2 diabetes mellitus without complications: Secondary | ICD-10-CM | POA: Diagnosis not present

## 2021-01-12 DIAGNOSIS — R161 Splenomegaly, not elsewhere classified: Secondary | ICD-10-CM | POA: Diagnosis not present

## 2021-01-12 DIAGNOSIS — D61818 Other pancytopenia: Secondary | ICD-10-CM | POA: Diagnosis not present

## 2021-01-12 DIAGNOSIS — R748 Abnormal levels of other serum enzymes: Secondary | ICD-10-CM | POA: Diagnosis not present

## 2021-01-12 DIAGNOSIS — Z87891 Personal history of nicotine dependence: Secondary | ICD-10-CM | POA: Diagnosis not present

## 2021-01-12 LAB — CMP (CANCER CENTER ONLY)
ALT: 66 U/L — ABNORMAL HIGH (ref 0–44)
AST: 46 U/L — ABNORMAL HIGH (ref 15–41)
Albumin: 4.4 g/dL (ref 3.5–5.0)
Alkaline Phosphatase: 27 U/L — ABNORMAL LOW (ref 38–126)
Anion gap: 12 (ref 5–15)
BUN: 17 mg/dL (ref 6–20)
CO2: 22 mmol/L (ref 22–32)
Calcium: 10.1 mg/dL (ref 8.9–10.3)
Chloride: 105 mmol/L (ref 98–111)
Creatinine: 1.17 mg/dL (ref 0.61–1.24)
GFR, Estimated: >60 mL/min (ref >60)
Glucose, Bld: 111 mg/dL — ABNORMAL HIGH (ref 70–99)
Potassium: 4.2 mmol/L (ref 3.5–5.1)
Sodium: 139 mmol/L (ref 135–145)
Total Bilirubin: 0.4 mg/dL (ref 0.3–1.2)
Total Protein: 8 g/dL (ref 6.5–8.1)

## 2021-01-12 NOTE — Assessment & Plan Note (Signed)
He has severe intermittent hypercalcemia likely due to thiazides Since discontinuation of hydrochlorothiazide and substituting with furosemide, his hypercalcemia finally resolved today I would defer to his primary care doctor to decide whether it is reasonable to continue on furosemide moving forward His PTH level is suppressed, going against primary hyperparathyroidism

## 2021-01-12 NOTE — Telephone Encounter (Signed)
Called and left below message. Ask him to call the office back if needed. 

## 2021-01-12 NOTE — Progress Notes (Signed)
[place] OFFICE PROGRESS NOTE  Edwin Bien, MD  ASSESSMENT & PLAN:  Pancytopenia, acquired [facility]) His pancytopenia is multifactorial His white blood cell count is only borderline His anemia is likely due to anemia of chronic disease These are all benign pathology He can be observed closely by primary care doctor and does not need long-term follow-up with me  Hypercalcemia He has severe intermittent hypercalcemia likely due to thiazides Since discontinuation of hydrochlorothiazide and substituting with furosemide, his hypercalcemia finally resolved today I would defer to his primary care doctor to decide whether it is reasonable to continue on furosemide moving forward His PTH level is suppressed, going against primary hyperparathyroidism  Elevated liver enzymes This is due to fatty liver change I reviewed CT imaging with the patient  We discussed importance of aggressive lifestyle changes and risk factor modification  Elevated serum creatinine He has intermittent elevated serum creatinine We discussed importance of aggressive hydration I show him pictures of his kidneys which look slightly small, likely due to medical renal disease  No orders of the defined types were placed in this encounter.   The total time spent in the appointment was 20 minutes encounter with patients including review of chart and various tests results, discussions about plan of care and coordination of care plan   All questions were answered. The patient knows to call the clinic with any problems, questions or concerns. No barriers to learning was detected.    Edwin Lark, MD 8/15/20225:06 PM  INTERVAL HISTORY: Edwin Nelson 50 y.o. male returns for further follow-up to review test results He tolerated furosemide well He is drinking lots of fluids He brought with him copies of prior CMP in the last few years which show intermittent hypercalcemia  SUMMARY OF HEMATOLOGIC  HISTORY: Edwin Nelson 50 y.o. male is here because of recent findings of pancytopenia  I have the opportunity to review his blood count dated back all the way to May 22, 2009 At that time, he has normal CBC with white count of 6.9, hemoglobin 14.3 and platelet count 199 Since 2012, he was noted to have mild intermittent leukopenia with a white count ranging between 4.1-4.5 Most recently, on 12/25/2020, he had blood work done as part of his routine visit with his primary care doctor and was noted to have white blood cell count 3.6, hemoglobin 11.9 and platelet count 197 He denies recurrent infection  He was also noted to be mildly anemic  He denies recent chest pain on exertion, shortness of breath on minimal exertion, pre-syncopal episodes, or palpitations. He had not noticed any recent bleeding such as epistaxis, hematuria or hematochezia The patient denies over the counter NSAID ingestion. He is on antiplatelets agents. His last colonoscopy was on 02/12/2020 Results: - The perianal and digital rectal examinations were normal. - Three sessile polyps were found in the transverse colon and ascending colon. The polyps were 3 to 6 mm in size. These polyps were removed with a cold snare. Resection and retrieval were complete. - A 10 mm polyp was found in the descending colon. The polyp was flat. The polyp was removed with a cold snare. Resection and retrieval were complete. - The exam was otherwise without abnormality on direct and retroflexion views.  He had no prior history or diagnosis of cancer. His age appropriate screening programs are up-to-date. He denies any pica and eats a variety of diet. He never donated blood or received blood transfusion CT imaging of the abdomen  and pelvis on 01/09/2021 showed no evidence of splenomegaly  I have reviewed the past medical history, past surgical history, social history and family history with the patient and they are unchanged from previous  note.  ALLERGIES:  has No Known Allergies.  MEDICATIONS:  Current Outpatient Medications  Medication Sig Dispense Refill   amLODipine (NORVASC) 2.5 MG tablet Take 2.5 mg by mouth daily.     ASPIRIN PO Take 81 mg by mouth daily.     Blood Glucose Monitoring Suppl (ONE TOUCH ULTRA SYSTEM KIT) W/DEVICE KIT 1 kit by Does not apply route once. 1 each 0   Cholecalciferol (VITAMIN D3 PO) Take 5,000 Units by mouth daily.     EPINEPHrine 0.3 mg/0.3 mL IJ SOAJ injection as needed.     fenofibrate 160 MG tablet Take 160 mg by mouth daily.     fluticasone (FLONASE) 50 MCG/ACT nasal spray USE 1 SPRAY NASALLY TWICE A DAY 16 g 10   furosemide (LASIX) 20 MG tablet Take 1 tablet (20 mg total) by mouth daily. 30 tablet 0   glucose blood test strip Use up to twice daily, dx 250.00 100 each 3   Lancets (ONETOUCH ULTRASOFT) lancets Use up to twice daily, dx 250.00 100 each 3   levocetirizine (XYZAL) 5 MG tablet Take 5 mg by mouth daily.     metFORMIN (GLUCOPHAGE) 500 MG tablet Take by mouth once. Take 1000 mg morning and 500 mg in the evening     metoprolol succinate (TOPROL-XL) 50 MG 24 hr tablet Take 50 mg by mouth daily.      montelukast (SINGULAIR) 10 MG tablet Take 10 mg by mouth daily.     PROVENTIL HFA 108 (90 BASE) MCG/ACT inhaler INHALE 2 PUFFS THREE TIMES A DAY AS NEEDED 18 g 1   QVAR 80 MCG/ACT inhaler INHALE 1 PUFF INTO THE LUNGS TWICE A DAY 3 g 3   rosuvastatin (CRESTOR) 20 MG tablet Take 20 mg by mouth daily.     Spacer/Aero-Holding Chambers (AEROCHAMBER PLUS FLO-VU W/MASK) MISC      valsartan (DIOVAN) 80 MG tablet Take 80 mg by mouth daily.     VASCEPA 1 g CAPS Take 1 g by mouth in the morning, at noon, in the evening, and at bedtime.      No current facility-administered medications for this visit.     REVIEW OF SYSTEMS:   Constitutional: Denies fevers, chills or night sweats Eyes: Denies blurriness of vision Ears, nose, mouth, throat, and face: Denies mucositis or sore  throat Respiratory: Denies cough, dyspnea or wheezes Cardiovascular: Denies palpitation, chest discomfort or lower extremity swelling Gastrointestinal:  Denies nausea, heartburn or change in bowel habits Skin: Denies abnormal skin rashes Lymphatics: Denies new lymphadenopathy or easy bruising Neurological:Denies numbness, tingling or new weaknesses Behavioral/Psych: Mood is stable, no new changes  All other systems were reviewed with the patient and are negative.  PHYSICAL EXAMINATION: ECOG PERFORMANCE STATUS: 0 - Asymptomatic  Vitals:   01/12/21 1343  BP: 111/66  Pulse: 73  Resp: 18  Temp: (!) 97.4 F (36.3 C)  SpO2: 100%   Filed Weights   01/12/21 1343  Weight: 239 lb 12.8 oz (108.8 kg)    GENERAL:alert, no distress and comfortable  NEURO: alert & oriented x 3 with fluent speech, no focal motor/sensory deficits  LABORATORY DATA:  I have reviewed the data as listed     Component Value Date/Time   NA 139 01/12/2021 1323   K 4.2 01/12/2021 1323  CL 105 01/12/2021 1323   CO2 22 01/12/2021 1323   GLUCOSE 111 (H) 01/12/2021 1323   BUN 17 01/12/2021 1323   CREATININE 1.17 01/12/2021 1323   CALCIUM 10.1 01/12/2021 1323   CALCIUM 10.8 (H) 01/09/2021 0819   PROT 8.0 01/12/2021 1323   ALBUMIN 4.4 01/12/2021 1323   AST 46 (H) 01/12/2021 1323   ALT 66 (H) 01/12/2021 1323   ALKPHOS 27 (L) 01/12/2021 1323   BILITOT 0.4 01/12/2021 1323   GFRNONAA >60 01/12/2021 1323   GFRAA 109 04/22/2008 1010    No results found for: SPEP, UPEP  Lab Results  Component Value Date   WBC 4.0 01/02/2021   NEUTROABS 1.8 01/02/2021   HGB 12.3 (L) 01/02/2021   HCT 37.2 (L) 01/02/2021   MCV 94.9 01/02/2021   PLT 184 01/02/2021      Chemistry      Component Value Date/Time   NA 139 01/12/2021 1323   K 4.2 01/12/2021 1323   CL 105 01/12/2021 1323   CO2 22 01/12/2021 1323   BUN 17 01/12/2021 1323   CREATININE 1.17 01/12/2021 1323      Component Value Date/Time   CALCIUM 10.1  01/12/2021 1323   CALCIUM 10.8 (H) 01/09/2021 0819   ALKPHOS 27 (L) 01/12/2021 1323   AST 46 (H) 01/12/2021 1323   ALT 66 (H) 01/12/2021 1323   BILITOT 0.4 01/12/2021 1323       RADIOGRAPHIC STUDIES: I have reviewed CT imaging with I have personally reviewed the radiological images as listed and agreed with the findings in the report.

## 2021-01-12 NOTE — Assessment & Plan Note (Signed)
His pancytopenia is multifactorial His white blood cell count is only borderline His anemia is likely due to anemia of chronic disease These are all benign pathology He can be observed closely by primary care doctor and does not need long-term follow-up with me

## 2021-01-12 NOTE — Assessment & Plan Note (Signed)
He has intermittent elevated serum creatinine We discussed importance of aggressive hydration I show him pictures of his kidneys which look slightly small, likely due to medical renal disease

## 2021-01-12 NOTE — Telephone Encounter (Signed)
-----   Message from Heath Lark, MD sent at 01/12/2021  2:52 PM EDT ----- Good news, pls call her Calcium is normal

## 2021-01-12 NOTE — Assessment & Plan Note (Signed)
This is due to fatty liver change I reviewed CT imaging with the patient  We discussed importance of aggressive lifestyle changes and risk factor modification

## 2021-01-14 DIAGNOSIS — J3089 Other allergic rhinitis: Secondary | ICD-10-CM | POA: Diagnosis not present

## 2021-01-14 DIAGNOSIS — J3081 Allergic rhinitis due to animal (cat) (dog) hair and dander: Secondary | ICD-10-CM | POA: Diagnosis not present

## 2021-01-14 DIAGNOSIS — J301 Allergic rhinitis due to pollen: Secondary | ICD-10-CM | POA: Diagnosis not present

## 2021-01-21 DIAGNOSIS — J3089 Other allergic rhinitis: Secondary | ICD-10-CM | POA: Diagnosis not present

## 2021-01-21 DIAGNOSIS — J301 Allergic rhinitis due to pollen: Secondary | ICD-10-CM | POA: Diagnosis not present

## 2021-01-21 DIAGNOSIS — J3081 Allergic rhinitis due to animal (cat) (dog) hair and dander: Secondary | ICD-10-CM | POA: Diagnosis not present

## 2021-01-23 DIAGNOSIS — D61818 Other pancytopenia: Secondary | ICD-10-CM | POA: Diagnosis not present

## 2021-01-28 DIAGNOSIS — J301 Allergic rhinitis due to pollen: Secondary | ICD-10-CM | POA: Diagnosis not present

## 2021-01-28 DIAGNOSIS — D61818 Other pancytopenia: Secondary | ICD-10-CM | POA: Diagnosis not present

## 2021-01-28 DIAGNOSIS — D638 Anemia in other chronic diseases classified elsewhere: Secondary | ICD-10-CM | POA: Diagnosis not present

## 2021-01-28 DIAGNOSIS — J3081 Allergic rhinitis due to animal (cat) (dog) hair and dander: Secondary | ICD-10-CM | POA: Diagnosis not present

## 2021-01-28 DIAGNOSIS — J3089 Other allergic rhinitis: Secondary | ICD-10-CM | POA: Diagnosis not present

## 2021-01-28 DIAGNOSIS — R600 Localized edema: Secondary | ICD-10-CM | POA: Diagnosis not present

## 2021-01-28 DIAGNOSIS — I10 Essential (primary) hypertension: Secondary | ICD-10-CM | POA: Diagnosis not present

## 2021-02-04 DIAGNOSIS — J301 Allergic rhinitis due to pollen: Secondary | ICD-10-CM | POA: Diagnosis not present

## 2021-02-04 DIAGNOSIS — J3089 Other allergic rhinitis: Secondary | ICD-10-CM | POA: Diagnosis not present

## 2021-02-04 DIAGNOSIS — J3081 Allergic rhinitis due to animal (cat) (dog) hair and dander: Secondary | ICD-10-CM | POA: Diagnosis not present

## 2021-02-05 DIAGNOSIS — Z23 Encounter for immunization: Secondary | ICD-10-CM | POA: Diagnosis not present

## 2021-02-11 DIAGNOSIS — J3089 Other allergic rhinitis: Secondary | ICD-10-CM | POA: Diagnosis not present

## 2021-02-11 DIAGNOSIS — J3081 Allergic rhinitis due to animal (cat) (dog) hair and dander: Secondary | ICD-10-CM | POA: Diagnosis not present

## 2021-02-11 DIAGNOSIS — J301 Allergic rhinitis due to pollen: Secondary | ICD-10-CM | POA: Diagnosis not present

## 2021-02-18 DIAGNOSIS — J3089 Other allergic rhinitis: Secondary | ICD-10-CM | POA: Diagnosis not present

## 2021-02-18 DIAGNOSIS — J301 Allergic rhinitis due to pollen: Secondary | ICD-10-CM | POA: Diagnosis not present

## 2021-02-18 DIAGNOSIS — J3081 Allergic rhinitis due to animal (cat) (dog) hair and dander: Secondary | ICD-10-CM | POA: Diagnosis not present

## 2021-02-24 DIAGNOSIS — J3089 Other allergic rhinitis: Secondary | ICD-10-CM | POA: Diagnosis not present

## 2021-02-24 DIAGNOSIS — J3081 Allergic rhinitis due to animal (cat) (dog) hair and dander: Secondary | ICD-10-CM | POA: Diagnosis not present

## 2021-02-24 DIAGNOSIS — J301 Allergic rhinitis due to pollen: Secondary | ICD-10-CM | POA: Diagnosis not present

## 2021-03-02 DIAGNOSIS — E1165 Type 2 diabetes mellitus with hyperglycemia: Secondary | ICD-10-CM | POA: Diagnosis not present

## 2021-03-02 DIAGNOSIS — I10 Essential (primary) hypertension: Secondary | ICD-10-CM | POA: Diagnosis not present

## 2021-03-02 DIAGNOSIS — Z7289 Other problems related to lifestyle: Secondary | ICD-10-CM | POA: Diagnosis not present

## 2021-03-03 DIAGNOSIS — J3089 Other allergic rhinitis: Secondary | ICD-10-CM | POA: Diagnosis not present

## 2021-03-03 DIAGNOSIS — J3081 Allergic rhinitis due to animal (cat) (dog) hair and dander: Secondary | ICD-10-CM | POA: Diagnosis not present

## 2021-03-03 DIAGNOSIS — J301 Allergic rhinitis due to pollen: Secondary | ICD-10-CM | POA: Diagnosis not present

## 2021-03-10 DIAGNOSIS — J3081 Allergic rhinitis due to animal (cat) (dog) hair and dander: Secondary | ICD-10-CM | POA: Diagnosis not present

## 2021-03-10 DIAGNOSIS — J301 Allergic rhinitis due to pollen: Secondary | ICD-10-CM | POA: Diagnosis not present

## 2021-03-10 DIAGNOSIS — J3089 Other allergic rhinitis: Secondary | ICD-10-CM | POA: Diagnosis not present

## 2021-03-12 DIAGNOSIS — Z23 Encounter for immunization: Secondary | ICD-10-CM | POA: Diagnosis not present

## 2021-03-17 DIAGNOSIS — J3089 Other allergic rhinitis: Secondary | ICD-10-CM | POA: Diagnosis not present

## 2021-03-17 DIAGNOSIS — J301 Allergic rhinitis due to pollen: Secondary | ICD-10-CM | POA: Diagnosis not present

## 2021-03-17 DIAGNOSIS — J3081 Allergic rhinitis due to animal (cat) (dog) hair and dander: Secondary | ICD-10-CM | POA: Diagnosis not present

## 2021-03-24 DIAGNOSIS — J301 Allergic rhinitis due to pollen: Secondary | ICD-10-CM | POA: Diagnosis not present

## 2021-03-25 DIAGNOSIS — J3081 Allergic rhinitis due to animal (cat) (dog) hair and dander: Secondary | ICD-10-CM | POA: Diagnosis not present

## 2021-03-25 DIAGNOSIS — J301 Allergic rhinitis due to pollen: Secondary | ICD-10-CM | POA: Diagnosis not present

## 2021-03-25 DIAGNOSIS — J3089 Other allergic rhinitis: Secondary | ICD-10-CM | POA: Diagnosis not present

## 2021-03-31 DIAGNOSIS — H52203 Unspecified astigmatism, bilateral: Secondary | ICD-10-CM | POA: Diagnosis not present

## 2021-03-31 DIAGNOSIS — H5213 Myopia, bilateral: Secondary | ICD-10-CM | POA: Diagnosis not present

## 2021-03-31 DIAGNOSIS — H524 Presbyopia: Secondary | ICD-10-CM | POA: Diagnosis not present

## 2021-03-31 DIAGNOSIS — H04123 Dry eye syndrome of bilateral lacrimal glands: Secondary | ICD-10-CM | POA: Diagnosis not present

## 2021-03-31 DIAGNOSIS — E119 Type 2 diabetes mellitus without complications: Secondary | ICD-10-CM | POA: Diagnosis not present

## 2021-04-01 DIAGNOSIS — J3089 Other allergic rhinitis: Secondary | ICD-10-CM | POA: Diagnosis not present

## 2021-04-01 DIAGNOSIS — J301 Allergic rhinitis due to pollen: Secondary | ICD-10-CM | POA: Diagnosis not present

## 2021-04-01 DIAGNOSIS — J3081 Allergic rhinitis due to animal (cat) (dog) hair and dander: Secondary | ICD-10-CM | POA: Diagnosis not present

## 2021-04-09 DIAGNOSIS — J3081 Allergic rhinitis due to animal (cat) (dog) hair and dander: Secondary | ICD-10-CM | POA: Diagnosis not present

## 2021-04-09 DIAGNOSIS — G4733 Obstructive sleep apnea (adult) (pediatric): Secondary | ICD-10-CM | POA: Diagnosis not present

## 2021-04-09 DIAGNOSIS — J3089 Other allergic rhinitis: Secondary | ICD-10-CM | POA: Diagnosis not present

## 2021-04-09 DIAGNOSIS — J301 Allergic rhinitis due to pollen: Secondary | ICD-10-CM | POA: Diagnosis not present

## 2021-04-10 DIAGNOSIS — E1169 Type 2 diabetes mellitus with other specified complication: Secondary | ICD-10-CM | POA: Diagnosis not present

## 2021-04-10 DIAGNOSIS — E782 Mixed hyperlipidemia: Secondary | ICD-10-CM | POA: Diagnosis not present

## 2021-04-10 DIAGNOSIS — I10 Essential (primary) hypertension: Secondary | ICD-10-CM | POA: Diagnosis not present

## 2021-04-14 DIAGNOSIS — J301 Allergic rhinitis due to pollen: Secondary | ICD-10-CM | POA: Diagnosis not present

## 2021-04-14 DIAGNOSIS — J3089 Other allergic rhinitis: Secondary | ICD-10-CM | POA: Diagnosis not present

## 2021-04-14 DIAGNOSIS — J3081 Allergic rhinitis due to animal (cat) (dog) hair and dander: Secondary | ICD-10-CM | POA: Diagnosis not present

## 2021-04-15 DIAGNOSIS — E1165 Type 2 diabetes mellitus with hyperglycemia: Secondary | ICD-10-CM | POA: Diagnosis not present

## 2021-04-15 DIAGNOSIS — I10 Essential (primary) hypertension: Secondary | ICD-10-CM | POA: Diagnosis not present

## 2021-04-15 DIAGNOSIS — E782 Mixed hyperlipidemia: Secondary | ICD-10-CM | POA: Diagnosis not present

## 2021-04-21 DIAGNOSIS — J3089 Other allergic rhinitis: Secondary | ICD-10-CM | POA: Diagnosis not present

## 2021-04-21 DIAGNOSIS — J3081 Allergic rhinitis due to animal (cat) (dog) hair and dander: Secondary | ICD-10-CM | POA: Diagnosis not present

## 2021-04-21 DIAGNOSIS — J301 Allergic rhinitis due to pollen: Secondary | ICD-10-CM | POA: Diagnosis not present

## 2021-04-29 DIAGNOSIS — H1045 Other chronic allergic conjunctivitis: Secondary | ICD-10-CM | POA: Diagnosis not present

## 2021-04-29 DIAGNOSIS — J4530 Mild persistent asthma, uncomplicated: Secondary | ICD-10-CM | POA: Diagnosis not present

## 2021-04-29 DIAGNOSIS — J3089 Other allergic rhinitis: Secondary | ICD-10-CM | POA: Diagnosis not present

## 2021-04-29 DIAGNOSIS — J3081 Allergic rhinitis due to animal (cat) (dog) hair and dander: Secondary | ICD-10-CM | POA: Diagnosis not present

## 2021-04-29 DIAGNOSIS — J301 Allergic rhinitis due to pollen: Secondary | ICD-10-CM | POA: Diagnosis not present

## 2021-05-07 DIAGNOSIS — J3081 Allergic rhinitis due to animal (cat) (dog) hair and dander: Secondary | ICD-10-CM | POA: Diagnosis not present

## 2021-05-07 DIAGNOSIS — J3089 Other allergic rhinitis: Secondary | ICD-10-CM | POA: Diagnosis not present

## 2021-05-07 DIAGNOSIS — J301 Allergic rhinitis due to pollen: Secondary | ICD-10-CM | POA: Diagnosis not present

## 2021-05-09 DIAGNOSIS — G4733 Obstructive sleep apnea (adult) (pediatric): Secondary | ICD-10-CM | POA: Diagnosis not present

## 2021-05-13 DIAGNOSIS — J301 Allergic rhinitis due to pollen: Secondary | ICD-10-CM | POA: Diagnosis not present

## 2021-05-13 DIAGNOSIS — J3081 Allergic rhinitis due to animal (cat) (dog) hair and dander: Secondary | ICD-10-CM | POA: Diagnosis not present

## 2021-05-13 DIAGNOSIS — J3089 Other allergic rhinitis: Secondary | ICD-10-CM | POA: Diagnosis not present

## 2021-05-18 DIAGNOSIS — J301 Allergic rhinitis due to pollen: Secondary | ICD-10-CM | POA: Diagnosis not present

## 2021-05-18 DIAGNOSIS — J3089 Other allergic rhinitis: Secondary | ICD-10-CM | POA: Diagnosis not present

## 2021-05-18 DIAGNOSIS — J3081 Allergic rhinitis due to animal (cat) (dog) hair and dander: Secondary | ICD-10-CM | POA: Diagnosis not present

## 2021-05-26 DIAGNOSIS — J301 Allergic rhinitis due to pollen: Secondary | ICD-10-CM | POA: Diagnosis not present

## 2021-05-26 DIAGNOSIS — J3089 Other allergic rhinitis: Secondary | ICD-10-CM | POA: Diagnosis not present

## 2021-05-26 DIAGNOSIS — J3081 Allergic rhinitis due to animal (cat) (dog) hair and dander: Secondary | ICD-10-CM | POA: Diagnosis not present

## 2021-05-28 DIAGNOSIS — E1159 Type 2 diabetes mellitus with other circulatory complications: Secondary | ICD-10-CM | POA: Diagnosis not present

## 2021-05-28 DIAGNOSIS — E1143 Type 2 diabetes mellitus with diabetic autonomic (poly)neuropathy: Secondary | ICD-10-CM | POA: Diagnosis not present

## 2021-05-28 DIAGNOSIS — I10 Essential (primary) hypertension: Secondary | ICD-10-CM | POA: Diagnosis not present

## 2021-05-28 DIAGNOSIS — E1165 Type 2 diabetes mellitus with hyperglycemia: Secondary | ICD-10-CM | POA: Diagnosis not present

## 2021-06-02 DIAGNOSIS — J3089 Other allergic rhinitis: Secondary | ICD-10-CM | POA: Diagnosis not present

## 2021-06-02 DIAGNOSIS — L0390 Cellulitis, unspecified: Secondary | ICD-10-CM | POA: Diagnosis not present

## 2021-06-02 DIAGNOSIS — J3081 Allergic rhinitis due to animal (cat) (dog) hair and dander: Secondary | ICD-10-CM | POA: Diagnosis not present

## 2021-06-02 DIAGNOSIS — J301 Allergic rhinitis due to pollen: Secondary | ICD-10-CM | POA: Diagnosis not present

## 2021-06-09 DIAGNOSIS — G4733 Obstructive sleep apnea (adult) (pediatric): Secondary | ICD-10-CM | POA: Diagnosis not present

## 2021-06-11 DIAGNOSIS — J301 Allergic rhinitis due to pollen: Secondary | ICD-10-CM | POA: Diagnosis not present

## 2021-06-11 DIAGNOSIS — J3089 Other allergic rhinitis: Secondary | ICD-10-CM | POA: Diagnosis not present

## 2021-06-11 DIAGNOSIS — J3081 Allergic rhinitis due to animal (cat) (dog) hair and dander: Secondary | ICD-10-CM | POA: Diagnosis not present

## 2021-06-18 DIAGNOSIS — J301 Allergic rhinitis due to pollen: Secondary | ICD-10-CM | POA: Diagnosis not present

## 2021-06-18 DIAGNOSIS — J3089 Other allergic rhinitis: Secondary | ICD-10-CM | POA: Diagnosis not present

## 2021-06-18 DIAGNOSIS — J3081 Allergic rhinitis due to animal (cat) (dog) hair and dander: Secondary | ICD-10-CM | POA: Diagnosis not present

## 2021-06-25 DIAGNOSIS — J3081 Allergic rhinitis due to animal (cat) (dog) hair and dander: Secondary | ICD-10-CM | POA: Diagnosis not present

## 2021-06-25 DIAGNOSIS — J301 Allergic rhinitis due to pollen: Secondary | ICD-10-CM | POA: Diagnosis not present

## 2021-06-25 DIAGNOSIS — J3089 Other allergic rhinitis: Secondary | ICD-10-CM | POA: Diagnosis not present

## 2021-07-01 DIAGNOSIS — J3081 Allergic rhinitis due to animal (cat) (dog) hair and dander: Secondary | ICD-10-CM | POA: Diagnosis not present

## 2021-07-01 DIAGNOSIS — J301 Allergic rhinitis due to pollen: Secondary | ICD-10-CM | POA: Diagnosis not present

## 2021-07-01 DIAGNOSIS — J3089 Other allergic rhinitis: Secondary | ICD-10-CM | POA: Diagnosis not present

## 2021-07-08 DIAGNOSIS — J3081 Allergic rhinitis due to animal (cat) (dog) hair and dander: Secondary | ICD-10-CM | POA: Diagnosis not present

## 2021-07-08 DIAGNOSIS — J301 Allergic rhinitis due to pollen: Secondary | ICD-10-CM | POA: Diagnosis not present

## 2021-07-08 DIAGNOSIS — J3089 Other allergic rhinitis: Secondary | ICD-10-CM | POA: Diagnosis not present

## 2021-07-14 DIAGNOSIS — G4733 Obstructive sleep apnea (adult) (pediatric): Secondary | ICD-10-CM | POA: Diagnosis not present

## 2021-07-15 DIAGNOSIS — J301 Allergic rhinitis due to pollen: Secondary | ICD-10-CM | POA: Diagnosis not present

## 2021-07-15 DIAGNOSIS — J3089 Other allergic rhinitis: Secondary | ICD-10-CM | POA: Diagnosis not present

## 2021-07-15 DIAGNOSIS — J3081 Allergic rhinitis due to animal (cat) (dog) hair and dander: Secondary | ICD-10-CM | POA: Diagnosis not present

## 2021-07-21 DIAGNOSIS — E1169 Type 2 diabetes mellitus with other specified complication: Secondary | ICD-10-CM | POA: Diagnosis not present

## 2021-07-22 DIAGNOSIS — J301 Allergic rhinitis due to pollen: Secondary | ICD-10-CM | POA: Diagnosis not present

## 2021-07-22 DIAGNOSIS — E1165 Type 2 diabetes mellitus with hyperglycemia: Secondary | ICD-10-CM | POA: Diagnosis not present

## 2021-07-22 DIAGNOSIS — I10 Essential (primary) hypertension: Secondary | ICD-10-CM | POA: Diagnosis not present

## 2021-07-22 DIAGNOSIS — J3081 Allergic rhinitis due to animal (cat) (dog) hair and dander: Secondary | ICD-10-CM | POA: Diagnosis not present

## 2021-07-22 DIAGNOSIS — E1159 Type 2 diabetes mellitus with other circulatory complications: Secondary | ICD-10-CM | POA: Diagnosis not present

## 2021-07-22 DIAGNOSIS — J3089 Other allergic rhinitis: Secondary | ICD-10-CM | POA: Diagnosis not present

## 2021-07-22 DIAGNOSIS — I152 Hypertension secondary to endocrine disorders: Secondary | ICD-10-CM | POA: Diagnosis not present

## 2021-07-29 DIAGNOSIS — J301 Allergic rhinitis due to pollen: Secondary | ICD-10-CM | POA: Diagnosis not present

## 2021-07-29 DIAGNOSIS — J3081 Allergic rhinitis due to animal (cat) (dog) hair and dander: Secondary | ICD-10-CM | POA: Diagnosis not present

## 2021-07-29 DIAGNOSIS — J3089 Other allergic rhinitis: Secondary | ICD-10-CM | POA: Diagnosis not present

## 2021-08-05 DIAGNOSIS — J3081 Allergic rhinitis due to animal (cat) (dog) hair and dander: Secondary | ICD-10-CM | POA: Diagnosis not present

## 2021-08-05 DIAGNOSIS — J3089 Other allergic rhinitis: Secondary | ICD-10-CM | POA: Diagnosis not present

## 2021-08-05 DIAGNOSIS — J301 Allergic rhinitis due to pollen: Secondary | ICD-10-CM | POA: Diagnosis not present

## 2021-08-11 DIAGNOSIS — M5412 Radiculopathy, cervical region: Secondary | ICD-10-CM | POA: Diagnosis not present

## 2021-08-11 DIAGNOSIS — G4733 Obstructive sleep apnea (adult) (pediatric): Secondary | ICD-10-CM | POA: Diagnosis not present

## 2021-08-12 DIAGNOSIS — J3089 Other allergic rhinitis: Secondary | ICD-10-CM | POA: Diagnosis not present

## 2021-08-12 DIAGNOSIS — J301 Allergic rhinitis due to pollen: Secondary | ICD-10-CM | POA: Diagnosis not present

## 2021-08-12 DIAGNOSIS — J3081 Allergic rhinitis due to animal (cat) (dog) hair and dander: Secondary | ICD-10-CM | POA: Diagnosis not present

## 2021-08-14 DIAGNOSIS — M5412 Radiculopathy, cervical region: Secondary | ICD-10-CM | POA: Diagnosis not present

## 2021-08-19 DIAGNOSIS — J3089 Other allergic rhinitis: Secondary | ICD-10-CM | POA: Diagnosis not present

## 2021-08-19 DIAGNOSIS — J3081 Allergic rhinitis due to animal (cat) (dog) hair and dander: Secondary | ICD-10-CM | POA: Diagnosis not present

## 2021-08-19 DIAGNOSIS — J301 Allergic rhinitis due to pollen: Secondary | ICD-10-CM | POA: Diagnosis not present

## 2021-08-20 DIAGNOSIS — M5412 Radiculopathy, cervical region: Secondary | ICD-10-CM | POA: Diagnosis not present

## 2021-08-25 ENCOUNTER — Telehealth (INDEPENDENT_AMBULATORY_CARE_PROVIDER_SITE_OTHER): Payer: BC Managed Care – PPO | Admitting: Adult Health

## 2021-08-25 DIAGNOSIS — Z9989 Dependence on other enabling machines and devices: Secondary | ICD-10-CM

## 2021-08-25 DIAGNOSIS — G4733 Obstructive sleep apnea (adult) (pediatric): Secondary | ICD-10-CM

## 2021-08-25 NOTE — Progress Notes (Signed)
? ? ? ?PATIENT: Edwin Nelson ?DOB: 23-Feb-1971 ? ?REASON FOR VISIT: follow up ?HISTORY FROM: patient ?PRIMARY NEUROLOGIST:  ? ?Virtual Visit via Video Note ? ?I connected with Edwin Nelson on 08/25/21 at  1:45 PM EDT by a video enabled telemedicine application located remotely at [facility] and verified that I am speaking with the correct person using two identifiers who was located at their own home. ?  ?I discussed the limitations of evaluation and management by telemedicine and the availability of in person appointments. The patient expressed understanding and agreed to proceed. ? ? ?PATIENT: Edwin Nelson ?DOB: 08/20/1970 ? ?REASON FOR VISIT: follow up ?HISTORY FROM: patient ? ?HISTORY OF PRESENT ILLNESS: ?Today 08/25/21: ? ?Mr. Burbach is a 51 year old male with a history of obstructive sleep apnea on CPAP.  He returns today for follow-up.  He reports that the CPAP is working well for him.  He denies any new issues. ?He does report that he has a bulging disk in the neck- currently currently in physical therapy.  He states sometimes this causes some numbness down the arm. ? ? ? ?REVIEW OF SYSTEMS: Out of a complete 14 system review of symptoms, the patient complains only of the following symptoms, and all other reviewed systems are negative. ? ?ALLERGIES: ?No Known Allergies ? ?HOME MEDICATIONS: ?Outpatient Medications Prior to Visit  ?Medication Sig Dispense Refill  ? amLODipine (NORVASC) 2.5 MG tablet Take 2.5 mg by mouth daily.    ? ASPIRIN PO Take 81 mg by mouth daily.    ? Blood Glucose Monitoring Suppl (ONE TOUCH ULTRA SYSTEM KIT) W/DEVICE KIT 1 kit by Does not apply route once. 1 each 0  ? Cholecalciferol (VITAMIN D3 PO) Take 5,000 Units by mouth daily.    ? EPINEPHrine 0.3 mg/0.3 mL IJ SOAJ injection as needed.    ? fenofibrate 160 MG tablet Take 160 mg by mouth daily.    ? fluticasone (FLONASE) 50 MCG/ACT nasal spray USE 1 SPRAY NASALLY TWICE A DAY 16 g 10  ? furosemide (LASIX) 20 MG  tablet Take 1 tablet (20 mg total) by mouth daily. 30 tablet 0  ? glucose blood test strip Use up to twice daily, dx 250.00 100 each 3  ? Lancets (ONETOUCH ULTRASOFT) lancets Use up to twice daily, dx 250.00 100 each 3  ? levocetirizine (XYZAL) 5 MG tablet Take 5 mg by mouth daily.    ? metFORMIN (GLUCOPHAGE) 500 MG tablet Take by mouth once. Take 1000 mg morning and 500 mg in the evening    ? metoprolol succinate (TOPROL-XL) 50 MG 24 hr tablet Take 50 mg by mouth daily.     ? montelukast (SINGULAIR) 10 MG tablet Take 10 mg by mouth daily.    ? PROVENTIL HFA 108 (90 BASE) MCG/ACT inhaler INHALE 2 PUFFS THREE TIMES A DAY AS NEEDED 18 g 1  ? QVAR 80 MCG/ACT inhaler INHALE 1 PUFF INTO THE LUNGS TWICE A DAY 3 g 3  ? rosuvastatin (CRESTOR) 20 MG tablet Take 20 mg by mouth daily.    ? Spacer/Aero-Holding Chambers (AEROCHAMBER PLUS FLO-VU W/MASK) MISC     ? valsartan (DIOVAN) 80 MG tablet Take 80 mg by mouth daily.    ? VASCEPA 1 g CAPS Take 1 g by mouth in the morning, at noon, in the evening, and at bedtime.     ? ?No facility-administered medications prior to visit.  ? ? ?PAST MEDICAL HISTORY: ?Past Medical History:  ?Diagnosis Date  ?  Allergy   ? seasonal allergies  ? Asthma   ? minor issue  ? Bulging of thoracic intervertebral disc 2019  ? C4-6  ? Diabetes mellitus without complication ([place])   ? on meds  ? Hyperlipidemia   ? on meds  ? Hypertension   ? on meds  ? Sleep apnea   ? uses CPAP  ? ? ?PAST SURGICAL HISTORY: ?Past Surgical History:  ?Procedure Laterality Date  ? COLONOSCOPY  02/12/2020  ? GANGLION CYST EXCISION Right 2004  ? TONSILLECTOMY AND ADENOIDECTOMY  1985  ? [place] EXTRACTION  1991  ? ? ?FAMILY HISTORY: ?Family History  ?Problem Relation Age of Onset  ? Diabetes Father   ? Heart disease Father   ? Stroke Father   ? Colon cancer Neg Hx   ? Colon polyps Neg Hx   ? Esophageal cancer Neg Hx   ? Rectal cancer Neg Hx   ? Stomach cancer Neg Hx   ? ? ?SOCIAL HISTORY: ?Social History  ? ?Socioeconomic  History  ? Marital status: Single  ?  Spouse name: Not on file  ? Number of children: Not on file  ? Years of education: Not on file  ? Highest education level: Not on file  ?Occupational History  ? Not on file  ?Tobacco Use  ? Smoking status: Former  ?  Years: 10.00  ?  Types: Cigarettes  ?  Quit date: 2008  ?  Years since quitting: 15.2  ? Smokeless tobacco: Never  ?Vaping Use  ? Vaping Use: Never used  ?Substance and Sexual Activity  ? Alcohol use: Yes  ?  Alcohol/week: 12.0 standard drinks  ?  Types: 12 Cans of beer per week  ? Drug use: No  ? Sexual activity: Not on file  ?Other Topics Concern  ? Not on file  ?Social History Narrative  ? Not on file  ? ?Social Determinants of Health  ? ?Financial Resource Strain: Not on file  ?Food Insecurity: Not on file  ?Transportation Needs: Not on file  ?Physical Activity: Not on file  ?Stress: Not on file  ?Social Connections: Not on file  ?Intimate Partner Violence: Not on file  ? ? ? ? ?PHYSICAL EXAM ?Generalized: Well developed, in no acute distress  ? ?Neurological examination  ?Mentation: Alert oriented to time, place, history taking. Follows all commands speech and language fluent ?Cranial nerve II-XII:Extraocular movements were full. Facial symmetry noted. midline. Head turning and shoulder shrug  were normal and symmetric. ? ?DIAGNOSTIC DATA (LABS, IMAGING, TESTING) ?- I reviewed patient records, labs, notes, testing and imaging myself where available. ? ?Lab Results  ?Component Value Date  ? WBC 4.0 01/02/2021  ? HGB 12.3 (L) 01/02/2021  ? HCT 37.2 (L) 01/02/2021  ? MCV 94.9 01/02/2021  ? PLT 184 01/02/2021  ? ?   ?Component Value Date/Time  ? NA 139 01/12/2021 1323  ? K 4.2 01/12/2021 1323  ? CL 105 01/12/2021 1323  ? CO2 22 01/12/2021 1323  ? GLUCOSE 111 (H) 01/12/2021 1323  ? BUN 17 01/12/2021 1323  ? CREATININE 1.17 01/12/2021 1323  ? CALCIUM 10.1 01/12/2021 1323  ? CALCIUM 10.8 (H) 01/09/2021 0819  ? PROT 8.0 01/12/2021 1323  ? ALBUMIN 4.4 01/12/2021 1323  ?  AST 46 (H) 01/12/2021 1323  ? ALT 66 (H) 01/12/2021 1323  ? ALKPHOS 27 (L) 01/12/2021 1323  ? BILITOT 0.4 01/12/2021 1323  ? GFRNONAA >60 01/12/2021 1323  ? GFRAA 109 04/22/2008 1010  ? ?Lab  Results  ?Component Value Date  ? CHOL 185 08/23/2012  ? HDL 47.80 08/23/2012  ? [place] 41 04/22/2008  ? LDLDIRECT 60.1 08/23/2012  ? TRIG (H) 08/23/2012  ?  401.0 Triglyceride is over 400; calculations on Lipids are invalid.  ? CHOLHDL 4 08/23/2012  ? ?Lab Results  ?Component Value Date  ? HGBA1C 6.3 (A) 09/07/2012  ? ?Lab Results  ?Component Value Date  ? OTLXBWIO03 240 01/02/2021  ? ?Lab Results  ?Component Value Date  ? TSH 2.47 08/23/2012  ? ? ? ? ?ASSESSMENT AND PLAN ?51 y.o. year old male  has a past medical history of Allergy, Asthma, Bulging of thoracic intervertebral disc (2019), Diabetes mellitus without complication ([place]), Hyperlipidemia, Hypertension, and Sleep apnea. here with: ? ?OSA on CPAP ? ?CPAP compliance excellent ?Residual AHI is good ?Encouraged patient to continue using CPAP nightly and > 4 hours each night ?F/U in 1 year or sooner if needed ? ? ? ?Ward Givens, MSN, NP-C 08/25/2021, 1:32 PM ?[facility] ?[address], [address] ?[place], [place] [address] ?[phone] ?

## 2021-08-26 DIAGNOSIS — J3089 Other allergic rhinitis: Secondary | ICD-10-CM | POA: Diagnosis not present

## 2021-08-26 DIAGNOSIS — J3081 Allergic rhinitis due to animal (cat) (dog) hair and dander: Secondary | ICD-10-CM | POA: Diagnosis not present

## 2021-08-26 DIAGNOSIS — J301 Allergic rhinitis due to pollen: Secondary | ICD-10-CM | POA: Diagnosis not present

## 2021-08-28 DIAGNOSIS — M5412 Radiculopathy, cervical region: Secondary | ICD-10-CM | POA: Diagnosis not present

## 2021-09-02 DIAGNOSIS — J3081 Allergic rhinitis due to animal (cat) (dog) hair and dander: Secondary | ICD-10-CM | POA: Diagnosis not present

## 2021-09-02 DIAGNOSIS — J301 Allergic rhinitis due to pollen: Secondary | ICD-10-CM | POA: Diagnosis not present

## 2021-09-02 DIAGNOSIS — M5412 Radiculopathy, cervical region: Secondary | ICD-10-CM | POA: Diagnosis not present

## 2021-09-02 DIAGNOSIS — J3089 Other allergic rhinitis: Secondary | ICD-10-CM | POA: Diagnosis not present

## 2021-09-03 DIAGNOSIS — J3081 Allergic rhinitis due to animal (cat) (dog) hair and dander: Secondary | ICD-10-CM | POA: Diagnosis not present

## 2021-09-03 DIAGNOSIS — J301 Allergic rhinitis due to pollen: Secondary | ICD-10-CM | POA: Diagnosis not present

## 2021-09-03 DIAGNOSIS — J3089 Other allergic rhinitis: Secondary | ICD-10-CM | POA: Diagnosis not present

## 2021-09-07 DIAGNOSIS — E1165 Type 2 diabetes mellitus with hyperglycemia: Secondary | ICD-10-CM | POA: Diagnosis not present

## 2021-09-07 DIAGNOSIS — J45990 Exercise induced bronchospasm: Secondary | ICD-10-CM | POA: Diagnosis not present

## 2021-09-07 DIAGNOSIS — J45909 Unspecified asthma, uncomplicated: Secondary | ICD-10-CM | POA: Diagnosis not present

## 2021-09-07 DIAGNOSIS — J4540 Moderate persistent asthma, uncomplicated: Secondary | ICD-10-CM | POA: Diagnosis not present

## 2021-09-09 DIAGNOSIS — J301 Allergic rhinitis due to pollen: Secondary | ICD-10-CM | POA: Diagnosis not present

## 2021-09-09 DIAGNOSIS — J3089 Other allergic rhinitis: Secondary | ICD-10-CM | POA: Diagnosis not present

## 2021-09-09 DIAGNOSIS — J3081 Allergic rhinitis due to animal (cat) (dog) hair and dander: Secondary | ICD-10-CM | POA: Diagnosis not present

## 2021-09-10 DIAGNOSIS — M5412 Radiculopathy, cervical region: Secondary | ICD-10-CM | POA: Diagnosis not present

## 2021-09-11 DIAGNOSIS — G4733 Obstructive sleep apnea (adult) (pediatric): Secondary | ICD-10-CM | POA: Diagnosis not present

## 2021-09-15 DIAGNOSIS — M5412 Radiculopathy, cervical region: Secondary | ICD-10-CM | POA: Diagnosis not present

## 2021-09-16 DIAGNOSIS — J3089 Other allergic rhinitis: Secondary | ICD-10-CM | POA: Diagnosis not present

## 2021-09-16 DIAGNOSIS — J301 Allergic rhinitis due to pollen: Secondary | ICD-10-CM | POA: Diagnosis not present

## 2021-09-16 DIAGNOSIS — J3081 Allergic rhinitis due to animal (cat) (dog) hair and dander: Secondary | ICD-10-CM | POA: Diagnosis not present

## 2021-09-18 DIAGNOSIS — M5412 Radiculopathy, cervical region: Secondary | ICD-10-CM | POA: Diagnosis not present

## 2021-09-22 DIAGNOSIS — M5412 Radiculopathy, cervical region: Secondary | ICD-10-CM | POA: Diagnosis not present

## 2021-09-23 DIAGNOSIS — J3089 Other allergic rhinitis: Secondary | ICD-10-CM | POA: Diagnosis not present

## 2021-09-23 DIAGNOSIS — J3081 Allergic rhinitis due to animal (cat) (dog) hair and dander: Secondary | ICD-10-CM | POA: Diagnosis not present

## 2021-09-23 DIAGNOSIS — J301 Allergic rhinitis due to pollen: Secondary | ICD-10-CM | POA: Diagnosis not present

## 2021-09-30 DIAGNOSIS — J3089 Other allergic rhinitis: Secondary | ICD-10-CM | POA: Diagnosis not present

## 2021-09-30 DIAGNOSIS — J301 Allergic rhinitis due to pollen: Secondary | ICD-10-CM | POA: Diagnosis not present

## 2021-09-30 DIAGNOSIS — J3081 Allergic rhinitis due to animal (cat) (dog) hair and dander: Secondary | ICD-10-CM | POA: Diagnosis not present

## 2021-10-07 DIAGNOSIS — J3089 Other allergic rhinitis: Secondary | ICD-10-CM | POA: Diagnosis not present

## 2021-10-07 DIAGNOSIS — J3081 Allergic rhinitis due to animal (cat) (dog) hair and dander: Secondary | ICD-10-CM | POA: Diagnosis not present

## 2021-10-07 DIAGNOSIS — J301 Allergic rhinitis due to pollen: Secondary | ICD-10-CM | POA: Diagnosis not present

## 2021-10-07 DIAGNOSIS — M5412 Radiculopathy, cervical region: Secondary | ICD-10-CM | POA: Diagnosis not present

## 2021-10-14 DIAGNOSIS — J3081 Allergic rhinitis due to animal (cat) (dog) hair and dander: Secondary | ICD-10-CM | POA: Diagnosis not present

## 2021-10-14 DIAGNOSIS — J3089 Other allergic rhinitis: Secondary | ICD-10-CM | POA: Diagnosis not present

## 2021-10-14 DIAGNOSIS — M5412 Radiculopathy, cervical region: Secondary | ICD-10-CM | POA: Diagnosis not present

## 2021-10-14 DIAGNOSIS — J301 Allergic rhinitis due to pollen: Secondary | ICD-10-CM | POA: Diagnosis not present

## 2021-10-15 DIAGNOSIS — G4733 Obstructive sleep apnea (adult) (pediatric): Secondary | ICD-10-CM | POA: Diagnosis not present

## 2021-10-21 DIAGNOSIS — J3081 Allergic rhinitis due to animal (cat) (dog) hair and dander: Secondary | ICD-10-CM | POA: Diagnosis not present

## 2021-10-21 DIAGNOSIS — J301 Allergic rhinitis due to pollen: Secondary | ICD-10-CM | POA: Diagnosis not present

## 2021-10-21 DIAGNOSIS — J3089 Other allergic rhinitis: Secondary | ICD-10-CM | POA: Diagnosis not present

## 2021-10-22 DIAGNOSIS — M5412 Radiculopathy, cervical region: Secondary | ICD-10-CM | POA: Diagnosis not present

## 2021-10-27 DIAGNOSIS — M5412 Radiculopathy, cervical region: Secondary | ICD-10-CM | POA: Diagnosis not present

## 2021-10-28 DIAGNOSIS — J3089 Other allergic rhinitis: Secondary | ICD-10-CM | POA: Diagnosis not present

## 2021-10-28 DIAGNOSIS — J301 Allergic rhinitis due to pollen: Secondary | ICD-10-CM | POA: Diagnosis not present

## 2021-10-28 DIAGNOSIS — J3081 Allergic rhinitis due to animal (cat) (dog) hair and dander: Secondary | ICD-10-CM | POA: Diagnosis not present

## 2021-11-04 DIAGNOSIS — J301 Allergic rhinitis due to pollen: Secondary | ICD-10-CM | POA: Diagnosis not present

## 2021-11-04 DIAGNOSIS — J3081 Allergic rhinitis due to animal (cat) (dog) hair and dander: Secondary | ICD-10-CM | POA: Diagnosis not present

## 2021-11-04 DIAGNOSIS — J3089 Other allergic rhinitis: Secondary | ICD-10-CM | POA: Diagnosis not present

## 2021-11-06 DIAGNOSIS — M5412 Radiculopathy, cervical region: Secondary | ICD-10-CM | POA: Diagnosis not present

## 2021-11-10 DIAGNOSIS — J3081 Allergic rhinitis due to animal (cat) (dog) hair and dander: Secondary | ICD-10-CM | POA: Diagnosis not present

## 2021-11-10 DIAGNOSIS — J3089 Other allergic rhinitis: Secondary | ICD-10-CM | POA: Diagnosis not present

## 2021-11-10 DIAGNOSIS — J301 Allergic rhinitis due to pollen: Secondary | ICD-10-CM | POA: Diagnosis not present

## 2021-11-11 DIAGNOSIS — M5412 Radiculopathy, cervical region: Secondary | ICD-10-CM | POA: Diagnosis not present

## 2021-11-15 DIAGNOSIS — G4733 Obstructive sleep apnea (adult) (pediatric): Secondary | ICD-10-CM | POA: Diagnosis not present

## 2021-11-18 DIAGNOSIS — J301 Allergic rhinitis due to pollen: Secondary | ICD-10-CM | POA: Diagnosis not present

## 2021-11-18 DIAGNOSIS — J3081 Allergic rhinitis due to animal (cat) (dog) hair and dander: Secondary | ICD-10-CM | POA: Diagnosis not present

## 2021-11-18 DIAGNOSIS — J3089 Other allergic rhinitis: Secondary | ICD-10-CM | POA: Diagnosis not present

## 2021-11-20 DIAGNOSIS — M5412 Radiculopathy, cervical region: Secondary | ICD-10-CM | POA: Diagnosis not present

## 2021-11-25 DIAGNOSIS — J3089 Other allergic rhinitis: Secondary | ICD-10-CM | POA: Diagnosis not present

## 2021-11-25 DIAGNOSIS — J301 Allergic rhinitis due to pollen: Secondary | ICD-10-CM | POA: Diagnosis not present

## 2021-11-25 DIAGNOSIS — J3081 Allergic rhinitis due to animal (cat) (dog) hair and dander: Secondary | ICD-10-CM | POA: Diagnosis not present

## 2021-12-03 DIAGNOSIS — J3089 Other allergic rhinitis: Secondary | ICD-10-CM | POA: Diagnosis not present

## 2021-12-03 DIAGNOSIS — J301 Allergic rhinitis due to pollen: Secondary | ICD-10-CM | POA: Diagnosis not present

## 2021-12-03 DIAGNOSIS — J3081 Allergic rhinitis due to animal (cat) (dog) hair and dander: Secondary | ICD-10-CM | POA: Diagnosis not present

## 2021-12-03 DIAGNOSIS — M5412 Radiculopathy, cervical region: Secondary | ICD-10-CM | POA: Diagnosis not present

## 2021-12-09 DIAGNOSIS — M5412 Radiculopathy, cervical region: Secondary | ICD-10-CM | POA: Diagnosis not present

## 2021-12-10 DIAGNOSIS — J301 Allergic rhinitis due to pollen: Secondary | ICD-10-CM | POA: Diagnosis not present

## 2021-12-10 DIAGNOSIS — J3089 Other allergic rhinitis: Secondary | ICD-10-CM | POA: Diagnosis not present

## 2021-12-10 DIAGNOSIS — J3081 Allergic rhinitis due to animal (cat) (dog) hair and dander: Secondary | ICD-10-CM | POA: Diagnosis not present

## 2021-12-15 DIAGNOSIS — G4733 Obstructive sleep apnea (adult) (pediatric): Secondary | ICD-10-CM | POA: Diagnosis not present

## 2021-12-16 DIAGNOSIS — J3081 Allergic rhinitis due to animal (cat) (dog) hair and dander: Secondary | ICD-10-CM | POA: Diagnosis not present

## 2021-12-16 DIAGNOSIS — J3089 Other allergic rhinitis: Secondary | ICD-10-CM | POA: Diagnosis not present

## 2021-12-16 DIAGNOSIS — J301 Allergic rhinitis due to pollen: Secondary | ICD-10-CM | POA: Diagnosis not present

## 2021-12-18 DIAGNOSIS — M5412 Radiculopathy, cervical region: Secondary | ICD-10-CM | POA: Diagnosis not present

## 2021-12-22 DIAGNOSIS — E1169 Type 2 diabetes mellitus with other specified complication: Secondary | ICD-10-CM | POA: Diagnosis not present

## 2021-12-22 DIAGNOSIS — Z0000 Encounter for general adult medical examination without abnormal findings: Secondary | ICD-10-CM | POA: Diagnosis not present

## 2021-12-22 DIAGNOSIS — Z114 Encounter for screening for human immunodeficiency virus [HIV]: Secondary | ICD-10-CM | POA: Diagnosis not present

## 2021-12-22 DIAGNOSIS — E782 Mixed hyperlipidemia: Secondary | ICD-10-CM | POA: Diagnosis not present

## 2021-12-22 DIAGNOSIS — Z125 Encounter for screening for malignant neoplasm of prostate: Secondary | ICD-10-CM | POA: Diagnosis not present

## 2021-12-24 DIAGNOSIS — J3081 Allergic rhinitis due to animal (cat) (dog) hair and dander: Secondary | ICD-10-CM | POA: Diagnosis not present

## 2021-12-24 DIAGNOSIS — Z0000 Encounter for general adult medical examination without abnormal findings: Secondary | ICD-10-CM | POA: Diagnosis not present

## 2021-12-24 DIAGNOSIS — J3089 Other allergic rhinitis: Secondary | ICD-10-CM | POA: Diagnosis not present

## 2021-12-24 DIAGNOSIS — J301 Allergic rhinitis due to pollen: Secondary | ICD-10-CM | POA: Diagnosis not present

## 2021-12-25 DIAGNOSIS — E782 Mixed hyperlipidemia: Secondary | ICD-10-CM | POA: Diagnosis not present

## 2021-12-25 DIAGNOSIS — M5412 Radiculopathy, cervical region: Secondary | ICD-10-CM | POA: Diagnosis not present

## 2021-12-25 DIAGNOSIS — Z6835 Body mass index (BMI) 35.0-35.9, adult: Secondary | ICD-10-CM | POA: Diagnosis not present

## 2021-12-25 DIAGNOSIS — I10 Essential (primary) hypertension: Secondary | ICD-10-CM | POA: Diagnosis not present

## 2021-12-30 DIAGNOSIS — Z23 Encounter for immunization: Secondary | ICD-10-CM | POA: Diagnosis not present

## 2022-01-01 DIAGNOSIS — M5412 Radiculopathy, cervical region: Secondary | ICD-10-CM | POA: Diagnosis not present

## 2022-01-01 IMAGING — CT CT ABD-PELV W/ CM
2 of 5 series · 16 of 46 positions shown, 18 images · IV contrast (OMNIPAQUE)
Comparison: None.

CLINICAL DATA: Pancytopenia, splenomegaly on physical exam

EXAM:
CT ABDOMEN AND PELVIS WITH CONTRAST
TECHNIQUE: Multidetector CT imaging of the abdomen and pelvis was performed
using the standard protocol following bolus administration of
intravenous contrast.
CONTRAST:  80mL OMNIPAQUE IOHEXOL 350 MG/ML SOLN

[Series 2: axial st · axial · 0.83mm/px · z∈[-827,-367]mm · 13 of 108 slices shown, 15 images]
[im 8/108  soft-tissue]
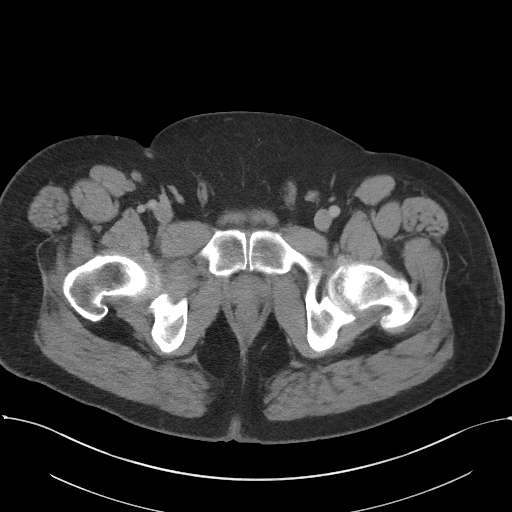
[im 8/108  bone]
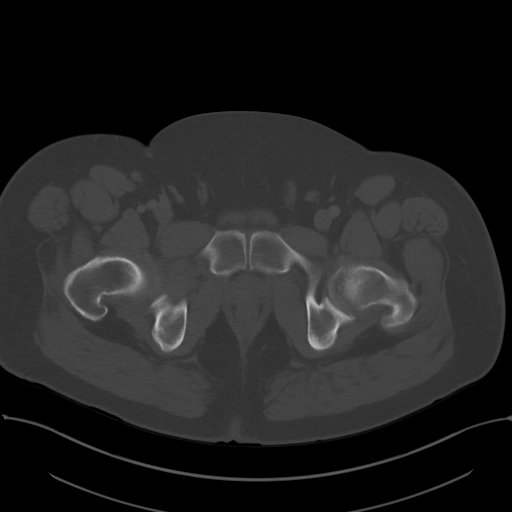
[im 15/108  soft-tissue]
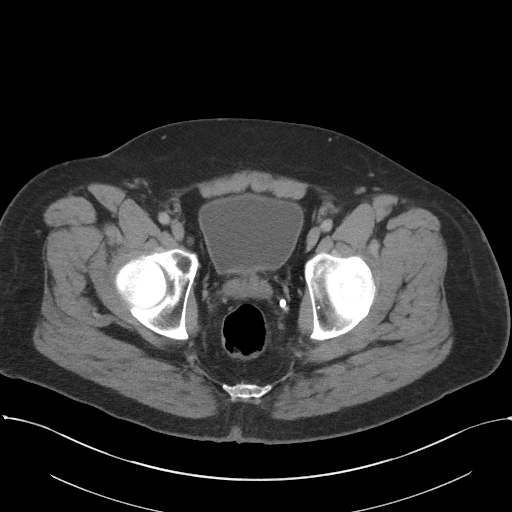
[im 22/108  soft-tissue]
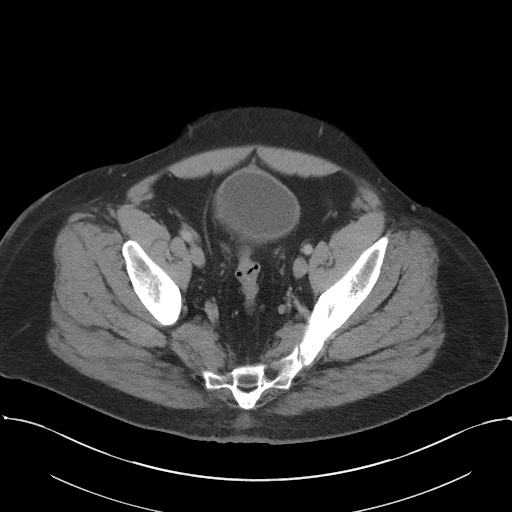
[im 29/108  soft-tissue]
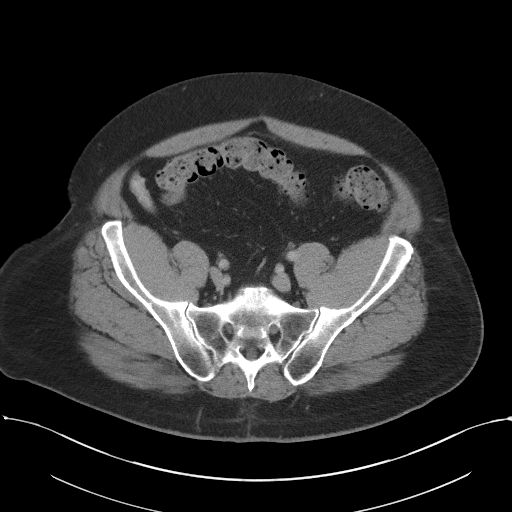
[im 36/108  soft-tissue]
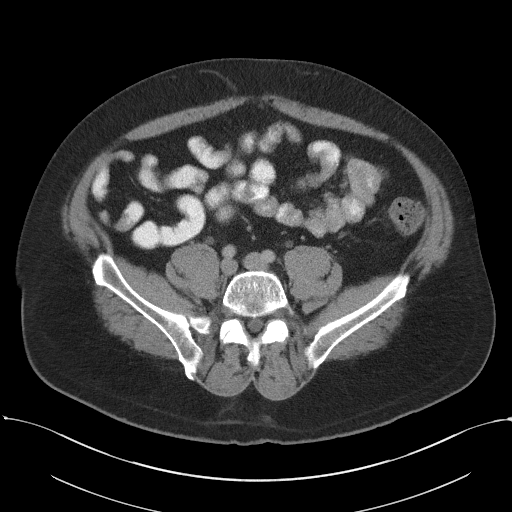
[im 43/108  soft-tissue]
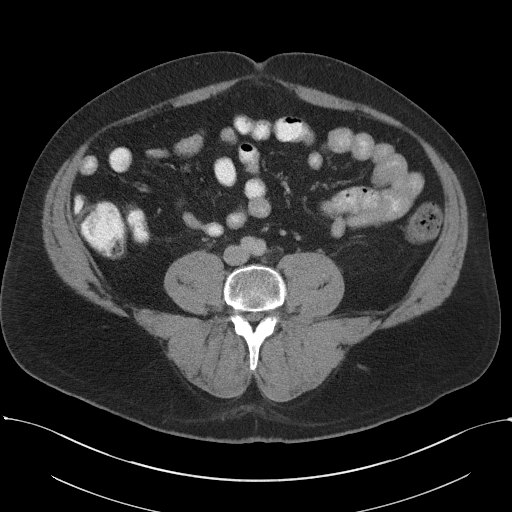
[im 58/108  soft-tissue]
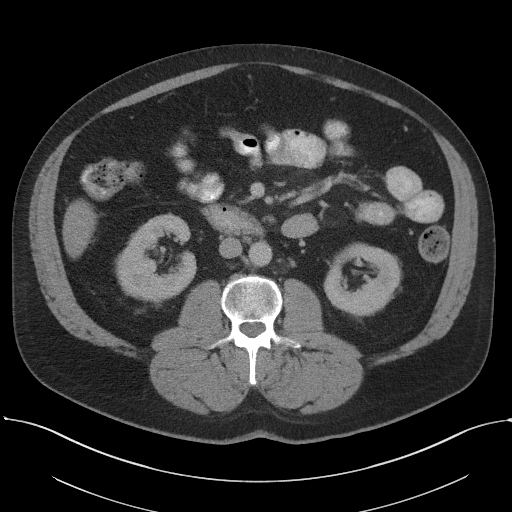
[im 65/108  soft-tissue]
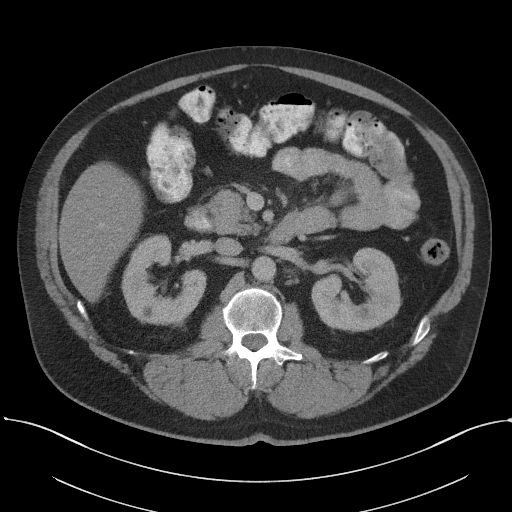
[im 72/108  soft-tissue]
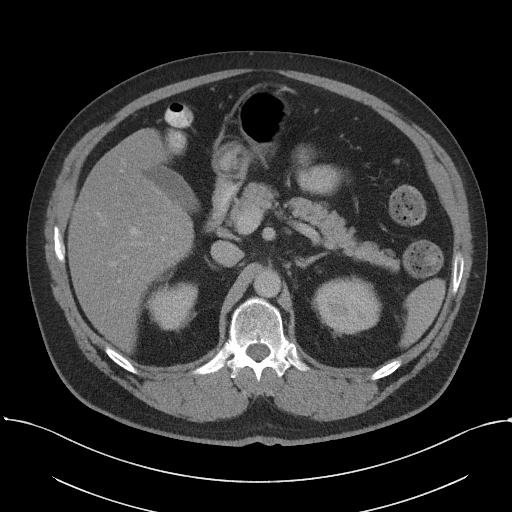
[im 72/108  bone]
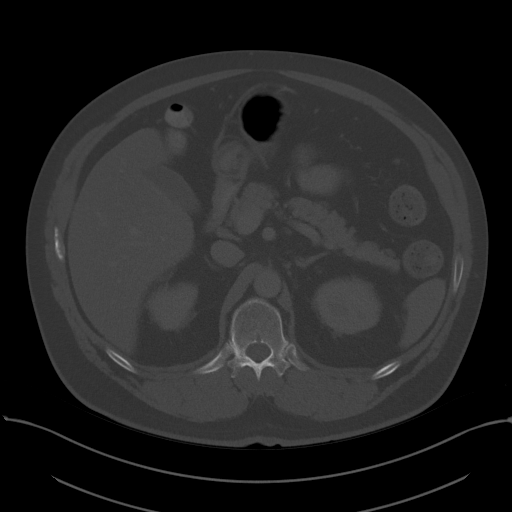
[im 79/108  soft-tissue]
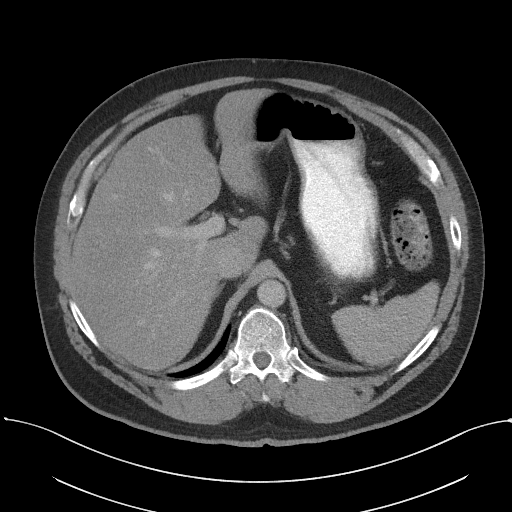
[im 86/108  soft-tissue]
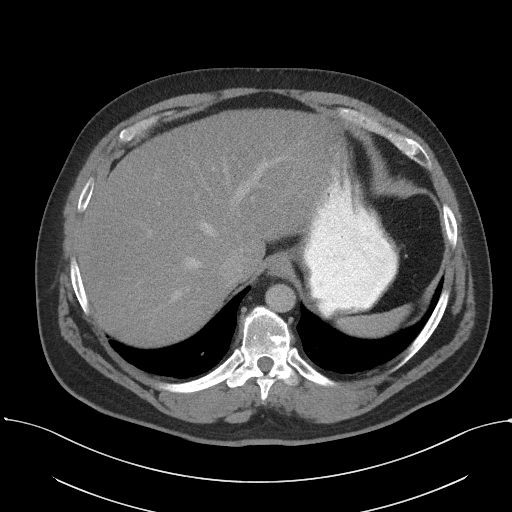
[im 93/108  soft-tissue]
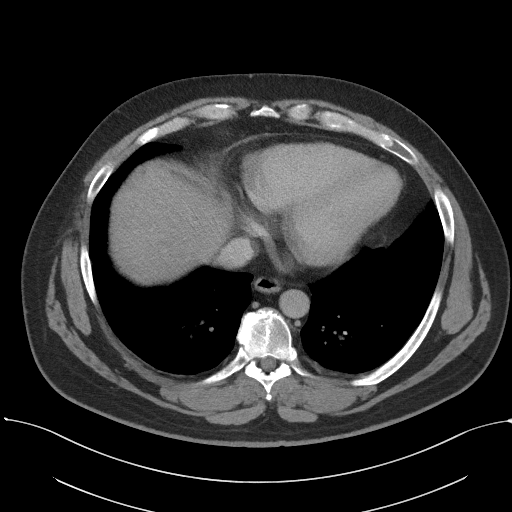
[im 100/108  soft-tissue]
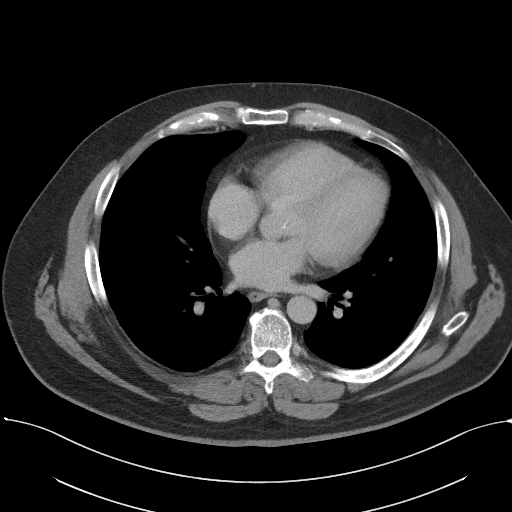

[Series 4: coronal st · coronal · 0.88mm/px · 3 of 111 slices shown]
[im 37/111  soft-tissue]
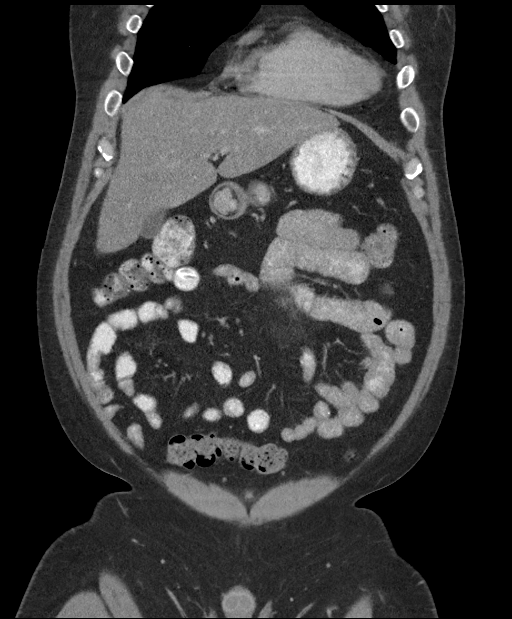
[im 49/111  soft-tissue]
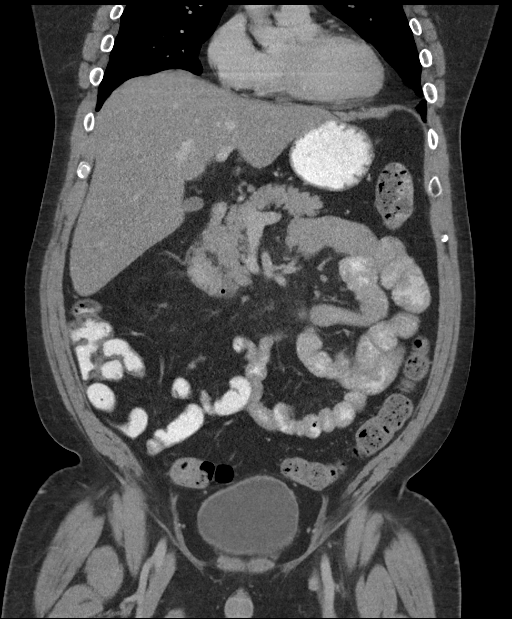
[im 62/111  soft-tissue]
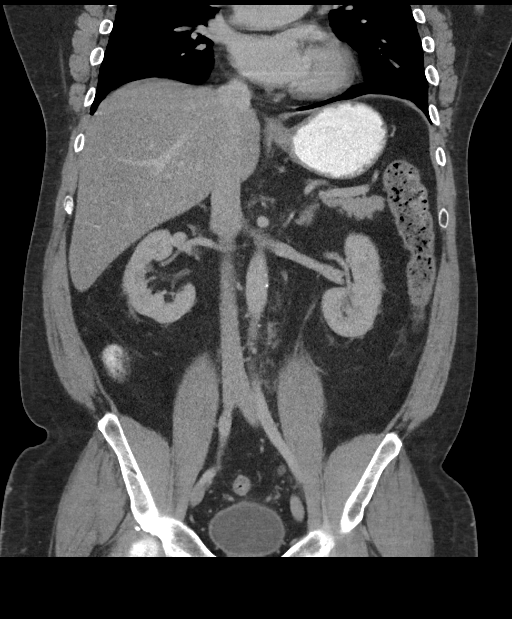

[16 of 46 positions shown; findings below may reference images not displayed]

FINDINGS: Lower chest: Lung bases are clear.

Hepatobiliary: Liver is within normal limits.

Gallbladder is unremarkable. No intrahepatic or extrahepatic ductal
dilatation.

Pancreas: Within normal limits.

Spleen: Normal in size, measuring 10.9 cm in craniocaudal dimension.

Adrenals/Urinary Tract: Adrenal glands are within normal limits.

Small bilateral renal cysts, measuring up to 12 mm on the left
(series 2/image 45). No hydronephrosis.

Bladder is within normal limits.

Stomach/Bowel: Stomach is within normal limits.

No evidence of bowel obstruction.

Appendix is not discretely visualized.

No colonic wall thickening or inflammatory changes.

Vascular/Lymphatic: No evidence of abdominal aortic aneurysm.

Atherosclerotic calcifications of the abdominal aorta and branch
vessels.

No suspicious abdominopelvic lymphadenopathy.

Reproductive: Prostate is unremarkable.

Other: No abdominopelvic ascites.

Musculoskeletal: Degenerative changes of the lower thoracic spine.
IMPRESSION: Spleen is normal in size.

No suspicious abdominopelvic lymphadenopathy.

## 2022-01-04 DIAGNOSIS — J301 Allergic rhinitis due to pollen: Secondary | ICD-10-CM | POA: Diagnosis not present

## 2022-01-04 DIAGNOSIS — J3089 Other allergic rhinitis: Secondary | ICD-10-CM | POA: Diagnosis not present

## 2022-01-04 DIAGNOSIS — J3081 Allergic rhinitis due to animal (cat) (dog) hair and dander: Secondary | ICD-10-CM | POA: Diagnosis not present

## 2022-01-12 DIAGNOSIS — J301 Allergic rhinitis due to pollen: Secondary | ICD-10-CM | POA: Diagnosis not present

## 2022-01-12 DIAGNOSIS — J3089 Other allergic rhinitis: Secondary | ICD-10-CM | POA: Diagnosis not present

## 2022-01-12 DIAGNOSIS — J3081 Allergic rhinitis due to animal (cat) (dog) hair and dander: Secondary | ICD-10-CM | POA: Diagnosis not present

## 2022-01-15 DIAGNOSIS — M5412 Radiculopathy, cervical region: Secondary | ICD-10-CM | POA: Diagnosis not present

## 2022-01-19 DIAGNOSIS — J301 Allergic rhinitis due to pollen: Secondary | ICD-10-CM | POA: Diagnosis not present

## 2022-01-19 DIAGNOSIS — J3081 Allergic rhinitis due to animal (cat) (dog) hair and dander: Secondary | ICD-10-CM | POA: Diagnosis not present

## 2022-01-19 DIAGNOSIS — J3089 Other allergic rhinitis: Secondary | ICD-10-CM | POA: Diagnosis not present

## 2022-01-27 DIAGNOSIS — J301 Allergic rhinitis due to pollen: Secondary | ICD-10-CM | POA: Diagnosis not present

## 2022-01-27 DIAGNOSIS — J3089 Other allergic rhinitis: Secondary | ICD-10-CM | POA: Diagnosis not present

## 2022-01-27 DIAGNOSIS — J3081 Allergic rhinitis due to animal (cat) (dog) hair and dander: Secondary | ICD-10-CM | POA: Diagnosis not present

## 2022-01-29 DIAGNOSIS — M5412 Radiculopathy, cervical region: Secondary | ICD-10-CM | POA: Diagnosis not present

## 2022-02-04 DIAGNOSIS — J3089 Other allergic rhinitis: Secondary | ICD-10-CM | POA: Diagnosis not present

## 2022-02-04 DIAGNOSIS — J301 Allergic rhinitis due to pollen: Secondary | ICD-10-CM | POA: Diagnosis not present

## 2022-02-04 DIAGNOSIS — J3081 Allergic rhinitis due to animal (cat) (dog) hair and dander: Secondary | ICD-10-CM | POA: Diagnosis not present

## 2022-02-12 DIAGNOSIS — J3081 Allergic rhinitis due to animal (cat) (dog) hair and dander: Secondary | ICD-10-CM | POA: Diagnosis not present

## 2022-02-12 DIAGNOSIS — J3089 Other allergic rhinitis: Secondary | ICD-10-CM | POA: Diagnosis not present

## 2022-02-12 DIAGNOSIS — J301 Allergic rhinitis due to pollen: Secondary | ICD-10-CM | POA: Diagnosis not present

## 2022-02-17 DIAGNOSIS — J3081 Allergic rhinitis due to animal (cat) (dog) hair and dander: Secondary | ICD-10-CM | POA: Diagnosis not present

## 2022-02-17 DIAGNOSIS — M5412 Radiculopathy, cervical region: Secondary | ICD-10-CM | POA: Diagnosis not present

## 2022-02-17 DIAGNOSIS — J301 Allergic rhinitis due to pollen: Secondary | ICD-10-CM | POA: Diagnosis not present

## 2022-02-17 DIAGNOSIS — J3089 Other allergic rhinitis: Secondary | ICD-10-CM | POA: Diagnosis not present

## 2022-02-18 DIAGNOSIS — J301 Allergic rhinitis due to pollen: Secondary | ICD-10-CM | POA: Diagnosis not present

## 2022-02-19 DIAGNOSIS — J3081 Allergic rhinitis due to animal (cat) (dog) hair and dander: Secondary | ICD-10-CM | POA: Diagnosis not present

## 2022-02-19 DIAGNOSIS — J3089 Other allergic rhinitis: Secondary | ICD-10-CM | POA: Diagnosis not present

## 2022-03-03 DIAGNOSIS — J3089 Other allergic rhinitis: Secondary | ICD-10-CM | POA: Diagnosis not present

## 2022-03-03 DIAGNOSIS — J301 Allergic rhinitis due to pollen: Secondary | ICD-10-CM | POA: Diagnosis not present

## 2022-03-03 DIAGNOSIS — J3081 Allergic rhinitis due to animal (cat) (dog) hair and dander: Secondary | ICD-10-CM | POA: Diagnosis not present

## 2022-03-10 DIAGNOSIS — Z23 Encounter for immunization: Secondary | ICD-10-CM | POA: Diagnosis not present

## 2022-03-15 DIAGNOSIS — J3081 Allergic rhinitis due to animal (cat) (dog) hair and dander: Secondary | ICD-10-CM | POA: Diagnosis not present

## 2022-03-15 DIAGNOSIS — J301 Allergic rhinitis due to pollen: Secondary | ICD-10-CM | POA: Diagnosis not present

## 2022-03-15 DIAGNOSIS — J3089 Other allergic rhinitis: Secondary | ICD-10-CM | POA: Diagnosis not present

## 2022-03-18 DIAGNOSIS — Z23 Encounter for immunization: Secondary | ICD-10-CM | POA: Diagnosis not present

## 2022-03-23 DIAGNOSIS — J3081 Allergic rhinitis due to animal (cat) (dog) hair and dander: Secondary | ICD-10-CM | POA: Diagnosis not present

## 2022-03-23 DIAGNOSIS — J3089 Other allergic rhinitis: Secondary | ICD-10-CM | POA: Diagnosis not present

## 2022-03-23 DIAGNOSIS — J301 Allergic rhinitis due to pollen: Secondary | ICD-10-CM | POA: Diagnosis not present

## 2022-03-26 DIAGNOSIS — E559 Vitamin D deficiency, unspecified: Secondary | ICD-10-CM | POA: Diagnosis not present

## 2022-03-26 DIAGNOSIS — E782 Mixed hyperlipidemia: Secondary | ICD-10-CM | POA: Diagnosis not present

## 2022-03-26 DIAGNOSIS — Z79899 Other long term (current) drug therapy: Secondary | ICD-10-CM | POA: Diagnosis not present

## 2022-03-26 DIAGNOSIS — I10 Essential (primary) hypertension: Secondary | ICD-10-CM | POA: Diagnosis not present

## 2022-03-31 DIAGNOSIS — J3081 Allergic rhinitis due to animal (cat) (dog) hair and dander: Secondary | ICD-10-CM | POA: Diagnosis not present

## 2022-03-31 DIAGNOSIS — J301 Allergic rhinitis due to pollen: Secondary | ICD-10-CM | POA: Diagnosis not present

## 2022-03-31 DIAGNOSIS — J3089 Other allergic rhinitis: Secondary | ICD-10-CM | POA: Diagnosis not present

## 2022-04-01 DIAGNOSIS — E1165 Type 2 diabetes mellitus with hyperglycemia: Secondary | ICD-10-CM | POA: Diagnosis not present

## 2022-04-01 DIAGNOSIS — E782 Mixed hyperlipidemia: Secondary | ICD-10-CM | POA: Diagnosis not present

## 2022-04-01 DIAGNOSIS — I10 Essential (primary) hypertension: Secondary | ICD-10-CM | POA: Diagnosis not present

## 2022-04-01 DIAGNOSIS — E559 Vitamin D deficiency, unspecified: Secondary | ICD-10-CM | POA: Diagnosis not present

## 2022-04-07 DIAGNOSIS — J301 Allergic rhinitis due to pollen: Secondary | ICD-10-CM | POA: Diagnosis not present

## 2022-04-07 DIAGNOSIS — J3081 Allergic rhinitis due to animal (cat) (dog) hair and dander: Secondary | ICD-10-CM | POA: Diagnosis not present

## 2022-04-07 DIAGNOSIS — J3089 Other allergic rhinitis: Secondary | ICD-10-CM | POA: Diagnosis not present

## 2022-04-13 DIAGNOSIS — J301 Allergic rhinitis due to pollen: Secondary | ICD-10-CM | POA: Diagnosis not present

## 2022-04-13 DIAGNOSIS — J3081 Allergic rhinitis due to animal (cat) (dog) hair and dander: Secondary | ICD-10-CM | POA: Diagnosis not present

## 2022-04-13 DIAGNOSIS — J3089 Other allergic rhinitis: Secondary | ICD-10-CM | POA: Diagnosis not present

## 2022-04-20 DIAGNOSIS — J3089 Other allergic rhinitis: Secondary | ICD-10-CM | POA: Diagnosis not present

## 2022-04-20 DIAGNOSIS — J301 Allergic rhinitis due to pollen: Secondary | ICD-10-CM | POA: Diagnosis not present

## 2022-04-20 DIAGNOSIS — J3081 Allergic rhinitis due to animal (cat) (dog) hair and dander: Secondary | ICD-10-CM | POA: Diagnosis not present

## 2022-04-26 DIAGNOSIS — J329 Chronic sinusitis, unspecified: Secondary | ICD-10-CM | POA: Diagnosis not present

## 2022-04-26 DIAGNOSIS — H109 Unspecified conjunctivitis: Secondary | ICD-10-CM | POA: Diagnosis not present

## 2022-04-28 DIAGNOSIS — J3081 Allergic rhinitis due to animal (cat) (dog) hair and dander: Secondary | ICD-10-CM | POA: Diagnosis not present

## 2022-04-28 DIAGNOSIS — J301 Allergic rhinitis due to pollen: Secondary | ICD-10-CM | POA: Diagnosis not present

## 2022-04-28 DIAGNOSIS — J3089 Other allergic rhinitis: Secondary | ICD-10-CM | POA: Diagnosis not present

## 2022-04-28 DIAGNOSIS — J4530 Mild persistent asthma, uncomplicated: Secondary | ICD-10-CM | POA: Diagnosis not present

## 2022-04-28 DIAGNOSIS — H1045 Other chronic allergic conjunctivitis: Secondary | ICD-10-CM | POA: Diagnosis not present

## 2022-04-30 DIAGNOSIS — B9689 Other specified bacterial agents as the cause of diseases classified elsewhere: Secondary | ICD-10-CM | POA: Diagnosis not present

## 2022-04-30 DIAGNOSIS — J329 Chronic sinusitis, unspecified: Secondary | ICD-10-CM | POA: Diagnosis not present

## 2022-04-30 DIAGNOSIS — J069 Acute upper respiratory infection, unspecified: Secondary | ICD-10-CM | POA: Diagnosis not present

## 2022-05-03 DIAGNOSIS — I10 Essential (primary) hypertension: Secondary | ICD-10-CM | POA: Diagnosis not present

## 2022-05-03 DIAGNOSIS — E1165 Type 2 diabetes mellitus with hyperglycemia: Secondary | ICD-10-CM | POA: Diagnosis not present

## 2022-05-04 DIAGNOSIS — J301 Allergic rhinitis due to pollen: Secondary | ICD-10-CM | POA: Diagnosis not present

## 2022-05-04 DIAGNOSIS — J3081 Allergic rhinitis due to animal (cat) (dog) hair and dander: Secondary | ICD-10-CM | POA: Diagnosis not present

## 2022-05-04 DIAGNOSIS — J3089 Other allergic rhinitis: Secondary | ICD-10-CM | POA: Diagnosis not present

## 2022-05-21 DIAGNOSIS — J3089 Other allergic rhinitis: Secondary | ICD-10-CM | POA: Diagnosis not present

## 2022-05-21 DIAGNOSIS — J3081 Allergic rhinitis due to animal (cat) (dog) hair and dander: Secondary | ICD-10-CM | POA: Diagnosis not present

## 2022-05-21 DIAGNOSIS — J301 Allergic rhinitis due to pollen: Secondary | ICD-10-CM | POA: Diagnosis not present

## 2022-05-27 DIAGNOSIS — J3089 Other allergic rhinitis: Secondary | ICD-10-CM | POA: Diagnosis not present

## 2022-05-27 DIAGNOSIS — J301 Allergic rhinitis due to pollen: Secondary | ICD-10-CM | POA: Diagnosis not present

## 2022-05-27 DIAGNOSIS — J3081 Allergic rhinitis due to animal (cat) (dog) hair and dander: Secondary | ICD-10-CM | POA: Diagnosis not present

## 2022-06-01 DIAGNOSIS — H52203 Unspecified astigmatism, bilateral: Secondary | ICD-10-CM | POA: Diagnosis not present

## 2022-06-01 DIAGNOSIS — E119 Type 2 diabetes mellitus without complications: Secondary | ICD-10-CM | POA: Diagnosis not present

## 2022-06-01 DIAGNOSIS — H5213 Myopia, bilateral: Secondary | ICD-10-CM | POA: Diagnosis not present

## 2022-06-01 DIAGNOSIS — H04123 Dry eye syndrome of bilateral lacrimal glands: Secondary | ICD-10-CM | POA: Diagnosis not present

## 2022-06-01 DIAGNOSIS — H524 Presbyopia: Secondary | ICD-10-CM | POA: Diagnosis not present

## 2022-06-02 DIAGNOSIS — J301 Allergic rhinitis due to pollen: Secondary | ICD-10-CM | POA: Diagnosis not present

## 2022-06-02 DIAGNOSIS — J3089 Other allergic rhinitis: Secondary | ICD-10-CM | POA: Diagnosis not present

## 2022-06-02 DIAGNOSIS — J3081 Allergic rhinitis due to animal (cat) (dog) hair and dander: Secondary | ICD-10-CM | POA: Diagnosis not present

## 2022-06-04 DIAGNOSIS — G4733 Obstructive sleep apnea (adult) (pediatric): Secondary | ICD-10-CM | POA: Diagnosis not present

## 2022-06-09 DIAGNOSIS — J3081 Allergic rhinitis due to animal (cat) (dog) hair and dander: Secondary | ICD-10-CM | POA: Diagnosis not present

## 2022-06-09 DIAGNOSIS — J301 Allergic rhinitis due to pollen: Secondary | ICD-10-CM | POA: Diagnosis not present

## 2022-06-09 DIAGNOSIS — J3089 Other allergic rhinitis: Secondary | ICD-10-CM | POA: Diagnosis not present

## 2022-06-16 DIAGNOSIS — J3081 Allergic rhinitis due to animal (cat) (dog) hair and dander: Secondary | ICD-10-CM | POA: Diagnosis not present

## 2022-06-16 DIAGNOSIS — J3089 Other allergic rhinitis: Secondary | ICD-10-CM | POA: Diagnosis not present

## 2022-06-16 DIAGNOSIS — J301 Allergic rhinitis due to pollen: Secondary | ICD-10-CM | POA: Diagnosis not present

## 2022-06-23 DIAGNOSIS — J3089 Other allergic rhinitis: Secondary | ICD-10-CM | POA: Diagnosis not present

## 2022-06-23 DIAGNOSIS — J301 Allergic rhinitis due to pollen: Secondary | ICD-10-CM | POA: Diagnosis not present

## 2022-06-23 DIAGNOSIS — J3081 Allergic rhinitis due to animal (cat) (dog) hair and dander: Secondary | ICD-10-CM | POA: Diagnosis not present

## 2022-06-30 DIAGNOSIS — J3089 Other allergic rhinitis: Secondary | ICD-10-CM | POA: Diagnosis not present

## 2022-06-30 DIAGNOSIS — J301 Allergic rhinitis due to pollen: Secondary | ICD-10-CM | POA: Diagnosis not present

## 2022-06-30 DIAGNOSIS — J3081 Allergic rhinitis due to animal (cat) (dog) hair and dander: Secondary | ICD-10-CM | POA: Diagnosis not present

## 2022-07-05 DIAGNOSIS — G4733 Obstructive sleep apnea (adult) (pediatric): Secondary | ICD-10-CM | POA: Diagnosis not present

## 2022-07-07 DIAGNOSIS — J3081 Allergic rhinitis due to animal (cat) (dog) hair and dander: Secondary | ICD-10-CM | POA: Diagnosis not present

## 2022-07-07 DIAGNOSIS — J301 Allergic rhinitis due to pollen: Secondary | ICD-10-CM | POA: Diagnosis not present

## 2022-07-07 DIAGNOSIS — J3089 Other allergic rhinitis: Secondary | ICD-10-CM | POA: Diagnosis not present

## 2022-07-14 DIAGNOSIS — J301 Allergic rhinitis due to pollen: Secondary | ICD-10-CM | POA: Diagnosis not present

## 2022-07-14 DIAGNOSIS — J3081 Allergic rhinitis due to animal (cat) (dog) hair and dander: Secondary | ICD-10-CM | POA: Diagnosis not present

## 2022-07-14 DIAGNOSIS — J3089 Other allergic rhinitis: Secondary | ICD-10-CM | POA: Diagnosis not present

## 2022-07-16 DIAGNOSIS — E1169 Type 2 diabetes mellitus with other specified complication: Secondary | ICD-10-CM | POA: Diagnosis not present

## 2022-07-21 DIAGNOSIS — D61818 Other pancytopenia: Secondary | ICD-10-CM | POA: Diagnosis not present

## 2022-07-21 DIAGNOSIS — J3089 Other allergic rhinitis: Secondary | ICD-10-CM | POA: Diagnosis not present

## 2022-07-21 DIAGNOSIS — J301 Allergic rhinitis due to pollen: Secondary | ICD-10-CM | POA: Diagnosis not present

## 2022-07-21 DIAGNOSIS — I10 Essential (primary) hypertension: Secondary | ICD-10-CM | POA: Diagnosis not present

## 2022-07-21 DIAGNOSIS — J3081 Allergic rhinitis due to animal (cat) (dog) hair and dander: Secondary | ICD-10-CM | POA: Diagnosis not present

## 2022-07-21 DIAGNOSIS — E1165 Type 2 diabetes mellitus with hyperglycemia: Secondary | ICD-10-CM | POA: Diagnosis not present

## 2022-07-28 DIAGNOSIS — J301 Allergic rhinitis due to pollen: Secondary | ICD-10-CM | POA: Diagnosis not present

## 2022-07-28 DIAGNOSIS — J3081 Allergic rhinitis due to animal (cat) (dog) hair and dander: Secondary | ICD-10-CM | POA: Diagnosis not present

## 2022-07-28 DIAGNOSIS — J3089 Other allergic rhinitis: Secondary | ICD-10-CM | POA: Diagnosis not present

## 2022-07-29 DIAGNOSIS — J3081 Allergic rhinitis due to animal (cat) (dog) hair and dander: Secondary | ICD-10-CM | POA: Diagnosis not present

## 2022-07-29 DIAGNOSIS — J3089 Other allergic rhinitis: Secondary | ICD-10-CM | POA: Diagnosis not present

## 2022-08-03 DIAGNOSIS — G4733 Obstructive sleep apnea (adult) (pediatric): Secondary | ICD-10-CM | POA: Diagnosis not present

## 2022-08-04 DIAGNOSIS — J3089 Other allergic rhinitis: Secondary | ICD-10-CM | POA: Diagnosis not present

## 2022-08-04 DIAGNOSIS — J3081 Allergic rhinitis due to animal (cat) (dog) hair and dander: Secondary | ICD-10-CM | POA: Diagnosis not present

## 2022-08-04 DIAGNOSIS — J301 Allergic rhinitis due to pollen: Secondary | ICD-10-CM | POA: Diagnosis not present

## 2022-08-11 DIAGNOSIS — J301 Allergic rhinitis due to pollen: Secondary | ICD-10-CM | POA: Diagnosis not present

## 2022-08-11 DIAGNOSIS — J3089 Other allergic rhinitis: Secondary | ICD-10-CM | POA: Diagnosis not present

## 2022-08-11 DIAGNOSIS — J3081 Allergic rhinitis due to animal (cat) (dog) hair and dander: Secondary | ICD-10-CM | POA: Diagnosis not present

## 2022-08-18 DIAGNOSIS — J3089 Other allergic rhinitis: Secondary | ICD-10-CM | POA: Diagnosis not present

## 2022-08-18 DIAGNOSIS — J301 Allergic rhinitis due to pollen: Secondary | ICD-10-CM | POA: Diagnosis not present

## 2022-08-18 DIAGNOSIS — J3081 Allergic rhinitis due to animal (cat) (dog) hair and dander: Secondary | ICD-10-CM | POA: Diagnosis not present

## 2022-08-23 DIAGNOSIS — J45909 Unspecified asthma, uncomplicated: Secondary | ICD-10-CM | POA: Diagnosis not present

## 2022-08-23 DIAGNOSIS — I10 Essential (primary) hypertension: Secondary | ICD-10-CM | POA: Diagnosis not present

## 2022-08-23 DIAGNOSIS — E1165 Type 2 diabetes mellitus with hyperglycemia: Secondary | ICD-10-CM | POA: Diagnosis not present

## 2022-08-30 DIAGNOSIS — J3081 Allergic rhinitis due to animal (cat) (dog) hair and dander: Secondary | ICD-10-CM | POA: Diagnosis not present

## 2022-08-30 DIAGNOSIS — J3089 Other allergic rhinitis: Secondary | ICD-10-CM | POA: Diagnosis not present

## 2022-08-30 DIAGNOSIS — J301 Allergic rhinitis due to pollen: Secondary | ICD-10-CM | POA: Diagnosis not present

## 2022-09-06 ENCOUNTER — Encounter: Payer: Self-pay | Admitting: *Deleted

## 2022-09-06 NOTE — Progress Notes (Unsigned)
PATIENT: Edwin Nelson DOB: [birth year]  REASON FOR VISIT: follow up HISTORY FROM: patient   Virtual Visit via Video Note  I connected with Luciano A Agrusa on 09/07/22 at  1:30 PM EDT by a video enabled telemedicine application located remotely at [facility] and verified that I am speaking with the correct person using two identifiers who was located in [place]   I discussed the limitations of evaluation and management by telemedicine and the availability of in person appointments. The patient expressed understanding and agreed to proceed.   PATIENT: [name] DOB: 1970-06-27  REASON FOR VISIT: follow up HISTORY FROM: patient  HISTORY OF PRESENT ILLNESS: Today 09/07/22:  Deaveon A Simmonds is a 52 y.o. male with a history of OSA on CPAP. Returns today for follow-up.  He reports that the CPAP continues to work well for him.  Denies any new symptoms.  Continues to find it beneficial.  Download is below       08/25/21:Mr. Ouyang is a 52 year old male with a history of obstructive sleep apnea on CPAP.  He returns today for follow-up.  He reports that the CPAP is working well for him.  He denies any new issues. He does report that he has a bulging disk in the neck- currently currently in physical therapy.  He states sometimes this causes some numbness down the arm.    REVIEW OF SYSTEMS: Out of a complete 14 system review of symptoms, the patient complains only of the following symptoms, and all other reviewed systems are negative.  ALLERGIES: No Known Allergies  HOME MEDICATIONS: Outpatient Medications Prior to Visit  Medication Sig Dispense Refill   amLODipine (NORVASC) 2.5 MG tablet Take 2.5 mg by mouth daily.     ASPIRIN PO Take 81 mg by mouth daily.     Blood Glucose Monitoring Suppl (ONE TOUCH ULTRA SYSTEM KIT) W/DEVICE KIT 1 kit by Does not apply route once. 1 each 0   Cholecalciferol (VITAMIN D3 PO) Take 5,000 Units by mouth daily.      EPINEPHrine 0.3 mg/0.3 mL IJ SOAJ injection as needed.     fenofibrate 160 MG tablet Take 160 mg by mouth daily.     fluticasone (FLONASE) 50 MCG/ACT nasal spray USE 1 SPRAY NASALLY TWICE A DAY 16 g 10   furosemide (LASIX) 20 MG tablet Take 1 tablet (20 mg total) by mouth daily. 30 tablet 0   glucose blood test strip Use up to twice daily, dx 250.00 100 each 3   Lancets (ONETOUCH ULTRASOFT) lancets Use up to twice daily, dx 250.00 100 each 3   levocetirizine (XYZAL) 5 MG tablet Take 5 mg by mouth daily.     metFORMIN (GLUCOPHAGE) 500 MG tablet Take by mouth once. Take 1000 mg morning and 500 mg in the evening     metoprolol succinate (TOPROL-XL) 50 MG 24 hr tablet Take 50 mg by mouth daily.      montelukast (SINGULAIR) 10 MG tablet Take 10 mg by mouth daily.     PROVENTIL HFA 108 (90 BASE) MCG/ACT inhaler INHALE 2 PUFFS THREE TIMES A DAY AS NEEDED 18 g 1   QVAR 80 MCG/ACT inhaler INHALE 1 PUFF INTO THE LUNGS TWICE A DAY 3 g 3   rosuvastatin (CRESTOR) 20 MG tablet Take 20 mg by mouth daily.     Spacer/Aero-Holding Chambers (AEROCHAMBER PLUS FLO-VU W/MASK) MISC      valsartan (DIOVAN) 80 MG tablet Take 80 mg by mouth  daily.     VASCEPA 1 g CAPS Take 1 g by mouth in the morning, at noon, in the evening, and at bedtime.      No facility-administered medications prior to visit.    PAST MEDICAL HISTORY: Past Medical History:  Diagnosis Date   Allergy    seasonal allergies   Asthma    minor issue   Bulging of thoracic intervertebral disc 2019   C4-6   Diabetes mellitus without complication (HCC)    on meds   Hyperlipidemia    on meds   Hypertension    on meds   Sleep apnea    uses CPAP    PAST SURGICAL HISTORY: Past Surgical History:  Procedure Laterality Date   COLONOSCOPY  02/12/2020   GANGLION CYST EXCISION Right 2004   TONSILLECTOMY AND ADENOIDECTOMY  1985   WISDOM TOOTH EXTRACTION  1991    FAMILY HISTORY: Family History  Problem Relation Age of Onset   Diabetes Father     Heart disease Father    Stroke Father    Colon cancer Neg Hx    Colon polyps Neg Hx    Esophageal cancer Neg Hx    Rectal cancer Neg Hx    Stomach cancer Neg Hx     SOCIAL HISTORY: Social History   Socioeconomic History   Marital status: Single    Spouse name: Not on file   Number of children: Not on file   Years of education: Not on file   Highest education level: Not on file  Occupational History   Not on file  Tobacco Use   Smoking status: Former    Years: 10    Types: Cigarettes    Quit date: 2008    Years since quitting: 16.2   Smokeless tobacco: Never  Vaping Use   Vaping Use: Never used  Substance and Sexual Activity   Alcohol use: Yes    Alcohol/week: 12.0 standard drinks of alcohol    Types: 12 Cans of beer per week   Drug use: No   Sexual activity: Not on file  Other Topics Concern   Not on file  Social History Narrative   Not on file   Social Determinants of Health   Financial Resource Strain: Not on file  Food Insecurity: Not on file  Transportation Needs: Not on file  Physical Activity: Not on file  Stress: Not on file  Social Connections: Not on file  Intimate Partner Violence: Not on file      PHYSICAL EXAM Generalized: Well developed, in no acute distress   Neurological examination  Mentation: Alert oriented to time, place, history taking. Follows all commands speech and language fluent Cranial nerve II-XII: Facial symmetry noted  DIAGNOSTIC DATA (LABS, IMAGING, TESTING) - I reviewed patient records, labs, notes, testing and imaging myself where available.  Lab Results  Component Value Date   WBC 4.0 01/02/2021   HGB 12.3 (L) 01/02/2021   HCT 37.2 (L) 01/02/2021   MCV 94.9 01/02/2021   PLT 184 01/02/2021      Component Value Date/Time   NA 139 01/12/2021 1323   K 4.2 01/12/2021 1323   CL 105 01/12/2021 1323   CO2 22 01/12/2021 1323   GLUCOSE 111 (H) 01/12/2021 1323   BUN 17 01/12/2021 1323   CREATININE 1.17 01/12/2021  1323   CALCIUM 10.1 01/12/2021 1323   CALCIUM 10.8 (H) 01/09/2021 0819   PROT 8.0 01/12/2021 1323   ALBUMIN 4.4 01/12/2021 1323   AST 46 (  H) 01/12/2021 1323   ALT 66 (H) 01/12/2021 1323   ALKPHOS 27 (L) 01/12/2021 1323   BILITOT 0.4 01/12/2021 1323   GFRNONAA >60 01/12/2021 1323   GFRAA 109 04/22/2008 1010   Lab Results  Component Value Date   CHOL 185 08/23/2012   HDL 47.80 08/23/2012   LDLCALC 41 04/22/2008   LDLDIRECT 60.1 08/23/2012   TRIG (H) 08/23/2012    401.0 Triglyceride is over 400; calculations on Lipids are invalid.   CHOLHDL 4 08/23/2012   Lab Results  Component Value Date   HGBA1C 6.3 (A) 09/07/2012   Lab Results  Component Value Date   VITAMINB12 240 01/02/2021   Lab Results  Component Value Date   TSH 2.47 08/23/2012      ASSESSMENT AND PLAN 52 y.o. year old male  has a past medical history of Allergy, Asthma, Bulging of thoracic intervertebral disc (2019), Diabetes mellitus without complication (HCC), Hyperlipidemia, Hypertension, and Sleep apnea. here with:  OSA on CPAP  CPAP compliance excellent Residual AHI is good Encouraged patient to continue using CPAP nightly and > 4 hours each night F/U in 1 year or sooner if needed    Butch Penny, MSN, NP-C 09/07/2022, 1:19 PM [facility] [address] [place], [place] [address] [phone]

## 2022-09-07 ENCOUNTER — Telehealth (INDEPENDENT_AMBULATORY_CARE_PROVIDER_SITE_OTHER): Payer: BC Managed Care – PPO | Admitting: Adult Health

## 2022-09-07 DIAGNOSIS — G4733 Obstructive sleep apnea (adult) (pediatric): Secondary | ICD-10-CM | POA: Diagnosis not present

## 2022-09-08 DIAGNOSIS — J3081 Allergic rhinitis due to animal (cat) (dog) hair and dander: Secondary | ICD-10-CM | POA: Diagnosis not present

## 2022-09-08 DIAGNOSIS — J3089 Other allergic rhinitis: Secondary | ICD-10-CM | POA: Diagnosis not present

## 2022-09-08 DIAGNOSIS — J301 Allergic rhinitis due to pollen: Secondary | ICD-10-CM | POA: Diagnosis not present

## 2022-09-15 DIAGNOSIS — J3081 Allergic rhinitis due to animal (cat) (dog) hair and dander: Secondary | ICD-10-CM | POA: Diagnosis not present

## 2022-09-15 DIAGNOSIS — J301 Allergic rhinitis due to pollen: Secondary | ICD-10-CM | POA: Diagnosis not present

## 2022-09-15 DIAGNOSIS — J3089 Other allergic rhinitis: Secondary | ICD-10-CM | POA: Diagnosis not present

## 2022-09-22 DIAGNOSIS — J3089 Other allergic rhinitis: Secondary | ICD-10-CM | POA: Diagnosis not present

## 2022-09-22 DIAGNOSIS — J3081 Allergic rhinitis due to animal (cat) (dog) hair and dander: Secondary | ICD-10-CM | POA: Diagnosis not present

## 2022-09-22 DIAGNOSIS — J301 Allergic rhinitis due to pollen: Secondary | ICD-10-CM | POA: Diagnosis not present

## 2022-09-27 DIAGNOSIS — G4733 Obstructive sleep apnea (adult) (pediatric): Secondary | ICD-10-CM | POA: Diagnosis not present

## 2022-09-29 DIAGNOSIS — J3081 Allergic rhinitis due to animal (cat) (dog) hair and dander: Secondary | ICD-10-CM | POA: Diagnosis not present

## 2022-09-29 DIAGNOSIS — J301 Allergic rhinitis due to pollen: Secondary | ICD-10-CM | POA: Diagnosis not present

## 2022-09-29 DIAGNOSIS — J3089 Other allergic rhinitis: Secondary | ICD-10-CM | POA: Diagnosis not present

## 2022-10-06 DIAGNOSIS — J3089 Other allergic rhinitis: Secondary | ICD-10-CM | POA: Diagnosis not present

## 2022-10-06 DIAGNOSIS — J301 Allergic rhinitis due to pollen: Secondary | ICD-10-CM | POA: Diagnosis not present

## 2022-10-06 DIAGNOSIS — J3081 Allergic rhinitis due to animal (cat) (dog) hair and dander: Secondary | ICD-10-CM | POA: Diagnosis not present

## 2022-10-13 DIAGNOSIS — J301 Allergic rhinitis due to pollen: Secondary | ICD-10-CM | POA: Diagnosis not present

## 2022-10-13 DIAGNOSIS — J3089 Other allergic rhinitis: Secondary | ICD-10-CM | POA: Diagnosis not present

## 2022-10-13 DIAGNOSIS — J3081 Allergic rhinitis due to animal (cat) (dog) hair and dander: Secondary | ICD-10-CM | POA: Diagnosis not present

## 2022-10-20 DIAGNOSIS — J3089 Other allergic rhinitis: Secondary | ICD-10-CM | POA: Diagnosis not present

## 2022-10-20 DIAGNOSIS — J301 Allergic rhinitis due to pollen: Secondary | ICD-10-CM | POA: Diagnosis not present

## 2022-10-20 DIAGNOSIS — J3081 Allergic rhinitis due to animal (cat) (dog) hair and dander: Secondary | ICD-10-CM | POA: Diagnosis not present

## 2022-10-26 DIAGNOSIS — J301 Allergic rhinitis due to pollen: Secondary | ICD-10-CM | POA: Diagnosis not present

## 2022-10-26 DIAGNOSIS — J3089 Other allergic rhinitis: Secondary | ICD-10-CM | POA: Diagnosis not present

## 2022-10-27 DIAGNOSIS — G4733 Obstructive sleep apnea (adult) (pediatric): Secondary | ICD-10-CM | POA: Diagnosis not present

## 2022-11-01 DIAGNOSIS — J3081 Allergic rhinitis due to animal (cat) (dog) hair and dander: Secondary | ICD-10-CM | POA: Diagnosis not present

## 2022-11-01 DIAGNOSIS — J3089 Other allergic rhinitis: Secondary | ICD-10-CM | POA: Diagnosis not present

## 2022-11-01 DIAGNOSIS — J301 Allergic rhinitis due to pollen: Secondary | ICD-10-CM | POA: Diagnosis not present

## 2022-11-08 DIAGNOSIS — J301 Allergic rhinitis due to pollen: Secondary | ICD-10-CM | POA: Diagnosis not present

## 2022-11-08 DIAGNOSIS — J3089 Other allergic rhinitis: Secondary | ICD-10-CM | POA: Diagnosis not present

## 2022-11-08 DIAGNOSIS — J3081 Allergic rhinitis due to animal (cat) (dog) hair and dander: Secondary | ICD-10-CM | POA: Diagnosis not present

## 2022-11-15 DIAGNOSIS — J3089 Other allergic rhinitis: Secondary | ICD-10-CM | POA: Diagnosis not present

## 2022-11-15 DIAGNOSIS — J3081 Allergic rhinitis due to animal (cat) (dog) hair and dander: Secondary | ICD-10-CM | POA: Diagnosis not present

## 2022-11-15 DIAGNOSIS — J301 Allergic rhinitis due to pollen: Secondary | ICD-10-CM | POA: Diagnosis not present

## 2022-11-19 DIAGNOSIS — E1165 Type 2 diabetes mellitus with hyperglycemia: Secondary | ICD-10-CM | POA: Diagnosis not present

## 2022-11-22 DIAGNOSIS — J301 Allergic rhinitis due to pollen: Secondary | ICD-10-CM | POA: Diagnosis not present

## 2022-11-22 DIAGNOSIS — J3089 Other allergic rhinitis: Secondary | ICD-10-CM | POA: Diagnosis not present

## 2022-11-22 DIAGNOSIS — J3081 Allergic rhinitis due to animal (cat) (dog) hair and dander: Secondary | ICD-10-CM | POA: Diagnosis not present

## 2022-11-25 DIAGNOSIS — Z789 Other specified health status: Secondary | ICD-10-CM | POA: Diagnosis not present

## 2022-11-25 DIAGNOSIS — I10 Essential (primary) hypertension: Secondary | ICD-10-CM | POA: Diagnosis not present

## 2022-11-25 DIAGNOSIS — E785 Hyperlipidemia, unspecified: Secondary | ICD-10-CM | POA: Diagnosis not present

## 2022-11-25 DIAGNOSIS — E1165 Type 2 diabetes mellitus with hyperglycemia: Secondary | ICD-10-CM | POA: Diagnosis not present

## 2022-11-27 DIAGNOSIS — G4733 Obstructive sleep apnea (adult) (pediatric): Secondary | ICD-10-CM | POA: Diagnosis not present

## 2022-11-29 DIAGNOSIS — J301 Allergic rhinitis due to pollen: Secondary | ICD-10-CM | POA: Diagnosis not present

## 2022-11-29 DIAGNOSIS — J3081 Allergic rhinitis due to animal (cat) (dog) hair and dander: Secondary | ICD-10-CM | POA: Diagnosis not present

## 2022-11-29 DIAGNOSIS — J3089 Other allergic rhinitis: Secondary | ICD-10-CM | POA: Diagnosis not present

## 2022-12-07 DIAGNOSIS — J3089 Other allergic rhinitis: Secondary | ICD-10-CM | POA: Diagnosis not present

## 2022-12-07 DIAGNOSIS — J3081 Allergic rhinitis due to animal (cat) (dog) hair and dander: Secondary | ICD-10-CM | POA: Diagnosis not present

## 2022-12-07 DIAGNOSIS — J301 Allergic rhinitis due to pollen: Secondary | ICD-10-CM | POA: Diagnosis not present

## 2022-12-14 DIAGNOSIS — J301 Allergic rhinitis due to pollen: Secondary | ICD-10-CM | POA: Diagnosis not present

## 2022-12-14 DIAGNOSIS — J3081 Allergic rhinitis due to animal (cat) (dog) hair and dander: Secondary | ICD-10-CM | POA: Diagnosis not present

## 2022-12-14 DIAGNOSIS — J3089 Other allergic rhinitis: Secondary | ICD-10-CM | POA: Diagnosis not present

## 2022-12-20 DIAGNOSIS — J301 Allergic rhinitis due to pollen: Secondary | ICD-10-CM | POA: Diagnosis not present

## 2022-12-20 DIAGNOSIS — J3089 Other allergic rhinitis: Secondary | ICD-10-CM | POA: Diagnosis not present

## 2022-12-20 DIAGNOSIS — J3081 Allergic rhinitis due to animal (cat) (dog) hair and dander: Secondary | ICD-10-CM | POA: Diagnosis not present

## 2022-12-24 DIAGNOSIS — E782 Mixed hyperlipidemia: Secondary | ICD-10-CM | POA: Diagnosis not present

## 2022-12-24 DIAGNOSIS — Z125 Encounter for screening for malignant neoplasm of prostate: Secondary | ICD-10-CM | POA: Diagnosis not present

## 2022-12-24 DIAGNOSIS — Z0000 Encounter for general adult medical examination without abnormal findings: Secondary | ICD-10-CM | POA: Diagnosis not present

## 2022-12-24 DIAGNOSIS — Z114 Encounter for screening for human immunodeficiency virus [HIV]: Secondary | ICD-10-CM | POA: Diagnosis not present

## 2022-12-27 DIAGNOSIS — Z0000 Encounter for general adult medical examination without abnormal findings: Secondary | ICD-10-CM | POA: Diagnosis not present

## 2022-12-27 DIAGNOSIS — Z23 Encounter for immunization: Secondary | ICD-10-CM | POA: Diagnosis not present

## 2023-01-03 DIAGNOSIS — J3089 Other allergic rhinitis: Secondary | ICD-10-CM | POA: Diagnosis not present

## 2023-01-03 DIAGNOSIS — J3081 Allergic rhinitis due to animal (cat) (dog) hair and dander: Secondary | ICD-10-CM | POA: Diagnosis not present

## 2023-01-03 DIAGNOSIS — J301 Allergic rhinitis due to pollen: Secondary | ICD-10-CM | POA: Diagnosis not present

## 2023-01-06 DIAGNOSIS — G4733 Obstructive sleep apnea (adult) (pediatric): Secondary | ICD-10-CM | POA: Diagnosis not present

## 2023-01-10 DIAGNOSIS — J3089 Other allergic rhinitis: Secondary | ICD-10-CM | POA: Diagnosis not present

## 2023-01-10 DIAGNOSIS — J3081 Allergic rhinitis due to animal (cat) (dog) hair and dander: Secondary | ICD-10-CM | POA: Diagnosis not present

## 2023-01-10 DIAGNOSIS — J301 Allergic rhinitis due to pollen: Secondary | ICD-10-CM | POA: Diagnosis not present

## 2023-01-17 DIAGNOSIS — J3089 Other allergic rhinitis: Secondary | ICD-10-CM | POA: Diagnosis not present

## 2023-01-17 DIAGNOSIS — J301 Allergic rhinitis due to pollen: Secondary | ICD-10-CM | POA: Diagnosis not present

## 2023-01-17 DIAGNOSIS — J3081 Allergic rhinitis due to animal (cat) (dog) hair and dander: Secondary | ICD-10-CM | POA: Diagnosis not present

## 2023-01-24 DIAGNOSIS — J3081 Allergic rhinitis due to animal (cat) (dog) hair and dander: Secondary | ICD-10-CM | POA: Diagnosis not present

## 2023-01-24 DIAGNOSIS — J301 Allergic rhinitis due to pollen: Secondary | ICD-10-CM | POA: Diagnosis not present

## 2023-01-24 DIAGNOSIS — J3089 Other allergic rhinitis: Secondary | ICD-10-CM | POA: Diagnosis not present

## 2023-02-02 DIAGNOSIS — J3089 Other allergic rhinitis: Secondary | ICD-10-CM | POA: Diagnosis not present

## 2023-02-02 DIAGNOSIS — J301 Allergic rhinitis due to pollen: Secondary | ICD-10-CM | POA: Diagnosis not present

## 2023-02-02 DIAGNOSIS — J3081 Allergic rhinitis due to animal (cat) (dog) hair and dander: Secondary | ICD-10-CM | POA: Diagnosis not present

## 2023-02-06 DIAGNOSIS — G4733 Obstructive sleep apnea (adult) (pediatric): Secondary | ICD-10-CM | POA: Diagnosis not present

## 2023-02-08 DIAGNOSIS — J3081 Allergic rhinitis due to animal (cat) (dog) hair and dander: Secondary | ICD-10-CM | POA: Diagnosis not present

## 2023-02-08 DIAGNOSIS — J301 Allergic rhinitis due to pollen: Secondary | ICD-10-CM | POA: Diagnosis not present

## 2023-02-08 DIAGNOSIS — J3089 Other allergic rhinitis: Secondary | ICD-10-CM | POA: Diagnosis not present

## 2023-02-15 DIAGNOSIS — J301 Allergic rhinitis due to pollen: Secondary | ICD-10-CM | POA: Diagnosis not present

## 2023-02-15 DIAGNOSIS — J3081 Allergic rhinitis due to animal (cat) (dog) hair and dander: Secondary | ICD-10-CM | POA: Diagnosis not present

## 2023-02-15 DIAGNOSIS — J3089 Other allergic rhinitis: Secondary | ICD-10-CM | POA: Diagnosis not present

## 2023-02-22 DIAGNOSIS — J301 Allergic rhinitis due to pollen: Secondary | ICD-10-CM | POA: Diagnosis not present

## 2023-02-22 DIAGNOSIS — J3081 Allergic rhinitis due to animal (cat) (dog) hair and dander: Secondary | ICD-10-CM | POA: Diagnosis not present

## 2023-02-22 DIAGNOSIS — J3089 Other allergic rhinitis: Secondary | ICD-10-CM | POA: Diagnosis not present

## 2023-02-28 DIAGNOSIS — J3081 Allergic rhinitis due to animal (cat) (dog) hair and dander: Secondary | ICD-10-CM | POA: Diagnosis not present

## 2023-02-28 DIAGNOSIS — J3089 Other allergic rhinitis: Secondary | ICD-10-CM | POA: Diagnosis not present

## 2023-02-28 DIAGNOSIS — J301 Allergic rhinitis due to pollen: Secondary | ICD-10-CM | POA: Diagnosis not present

## 2023-03-01 DIAGNOSIS — J301 Allergic rhinitis due to pollen: Secondary | ICD-10-CM | POA: Diagnosis not present

## 2023-03-02 DIAGNOSIS — J3081 Allergic rhinitis due to animal (cat) (dog) hair and dander: Secondary | ICD-10-CM | POA: Diagnosis not present

## 2023-03-02 DIAGNOSIS — J3089 Other allergic rhinitis: Secondary | ICD-10-CM | POA: Diagnosis not present

## 2023-03-08 DIAGNOSIS — G4733 Obstructive sleep apnea (adult) (pediatric): Secondary | ICD-10-CM | POA: Diagnosis not present

## 2023-03-09 DIAGNOSIS — J3089 Other allergic rhinitis: Secondary | ICD-10-CM | POA: Diagnosis not present

## 2023-03-09 DIAGNOSIS — J301 Allergic rhinitis due to pollen: Secondary | ICD-10-CM | POA: Diagnosis not present

## 2023-03-09 DIAGNOSIS — J3081 Allergic rhinitis due to animal (cat) (dog) hair and dander: Secondary | ICD-10-CM | POA: Diagnosis not present

## 2023-03-16 DIAGNOSIS — J3081 Allergic rhinitis due to animal (cat) (dog) hair and dander: Secondary | ICD-10-CM | POA: Diagnosis not present

## 2023-03-16 DIAGNOSIS — J3089 Other allergic rhinitis: Secondary | ICD-10-CM | POA: Diagnosis not present

## 2023-03-16 DIAGNOSIS — J301 Allergic rhinitis due to pollen: Secondary | ICD-10-CM | POA: Diagnosis not present

## 2023-03-23 DIAGNOSIS — J3089 Other allergic rhinitis: Secondary | ICD-10-CM | POA: Diagnosis not present

## 2023-03-23 DIAGNOSIS — J3081 Allergic rhinitis due to animal (cat) (dog) hair and dander: Secondary | ICD-10-CM | POA: Diagnosis not present

## 2023-03-23 DIAGNOSIS — J301 Allergic rhinitis due to pollen: Secondary | ICD-10-CM | POA: Diagnosis not present

## 2023-03-24 DIAGNOSIS — I10 Essential (primary) hypertension: Secondary | ICD-10-CM | POA: Diagnosis not present

## 2023-03-24 DIAGNOSIS — E1165 Type 2 diabetes mellitus with hyperglycemia: Secondary | ICD-10-CM | POA: Diagnosis not present

## 2023-03-30 DIAGNOSIS — J3081 Allergic rhinitis due to animal (cat) (dog) hair and dander: Secondary | ICD-10-CM | POA: Diagnosis not present

## 2023-03-30 DIAGNOSIS — J301 Allergic rhinitis due to pollen: Secondary | ICD-10-CM | POA: Diagnosis not present

## 2023-03-30 DIAGNOSIS — J3089 Other allergic rhinitis: Secondary | ICD-10-CM | POA: Diagnosis not present

## 2023-04-06 DIAGNOSIS — J301 Allergic rhinitis due to pollen: Secondary | ICD-10-CM | POA: Diagnosis not present

## 2023-04-06 DIAGNOSIS — J3081 Allergic rhinitis due to animal (cat) (dog) hair and dander: Secondary | ICD-10-CM | POA: Diagnosis not present

## 2023-04-06 DIAGNOSIS — J3089 Other allergic rhinitis: Secondary | ICD-10-CM | POA: Diagnosis not present

## 2023-04-13 DIAGNOSIS — J301 Allergic rhinitis due to pollen: Secondary | ICD-10-CM | POA: Diagnosis not present

## 2023-04-13 DIAGNOSIS — J3089 Other allergic rhinitis: Secondary | ICD-10-CM | POA: Diagnosis not present

## 2023-04-13 DIAGNOSIS — J3081 Allergic rhinitis due to animal (cat) (dog) hair and dander: Secondary | ICD-10-CM | POA: Diagnosis not present

## 2023-04-20 DIAGNOSIS — J301 Allergic rhinitis due to pollen: Secondary | ICD-10-CM | POA: Diagnosis not present

## 2023-04-20 DIAGNOSIS — J3089 Other allergic rhinitis: Secondary | ICD-10-CM | POA: Diagnosis not present

## 2023-04-20 DIAGNOSIS — J4530 Mild persistent asthma, uncomplicated: Secondary | ICD-10-CM | POA: Diagnosis not present

## 2023-04-20 DIAGNOSIS — J3081 Allergic rhinitis due to animal (cat) (dog) hair and dander: Secondary | ICD-10-CM | POA: Diagnosis not present

## 2023-04-20 DIAGNOSIS — H1045 Other chronic allergic conjunctivitis: Secondary | ICD-10-CM | POA: Diagnosis not present

## 2023-04-26 DIAGNOSIS — J3081 Allergic rhinitis due to animal (cat) (dog) hair and dander: Secondary | ICD-10-CM | POA: Diagnosis not present

## 2023-04-26 DIAGNOSIS — J301 Allergic rhinitis due to pollen: Secondary | ICD-10-CM | POA: Diagnosis not present

## 2023-04-26 DIAGNOSIS — J3089 Other allergic rhinitis: Secondary | ICD-10-CM | POA: Diagnosis not present

## 2023-05-03 DIAGNOSIS — J3081 Allergic rhinitis due to animal (cat) (dog) hair and dander: Secondary | ICD-10-CM | POA: Diagnosis not present

## 2023-05-03 DIAGNOSIS — J301 Allergic rhinitis due to pollen: Secondary | ICD-10-CM | POA: Diagnosis not present

## 2023-05-03 DIAGNOSIS — J3089 Other allergic rhinitis: Secondary | ICD-10-CM | POA: Diagnosis not present

## 2023-05-10 DIAGNOSIS — J301 Allergic rhinitis due to pollen: Secondary | ICD-10-CM | POA: Diagnosis not present

## 2023-05-10 DIAGNOSIS — J3081 Allergic rhinitis due to animal (cat) (dog) hair and dander: Secondary | ICD-10-CM | POA: Diagnosis not present

## 2023-05-10 DIAGNOSIS — J3089 Other allergic rhinitis: Secondary | ICD-10-CM | POA: Diagnosis not present

## 2023-05-17 DIAGNOSIS — J3089 Other allergic rhinitis: Secondary | ICD-10-CM | POA: Diagnosis not present

## 2023-05-17 DIAGNOSIS — J3081 Allergic rhinitis due to animal (cat) (dog) hair and dander: Secondary | ICD-10-CM | POA: Diagnosis not present

## 2023-05-17 DIAGNOSIS — J301 Allergic rhinitis due to pollen: Secondary | ICD-10-CM | POA: Diagnosis not present

## 2023-05-23 DIAGNOSIS — J3089 Other allergic rhinitis: Secondary | ICD-10-CM | POA: Diagnosis not present

## 2023-05-23 DIAGNOSIS — J301 Allergic rhinitis due to pollen: Secondary | ICD-10-CM | POA: Diagnosis not present

## 2023-05-23 DIAGNOSIS — J3081 Allergic rhinitis due to animal (cat) (dog) hair and dander: Secondary | ICD-10-CM | POA: Diagnosis not present

## 2023-05-30 DIAGNOSIS — J301 Allergic rhinitis due to pollen: Secondary | ICD-10-CM | POA: Diagnosis not present

## 2023-05-30 DIAGNOSIS — J3081 Allergic rhinitis due to animal (cat) (dog) hair and dander: Secondary | ICD-10-CM | POA: Diagnosis not present

## 2023-05-30 DIAGNOSIS — J3089 Other allergic rhinitis: Secondary | ICD-10-CM | POA: Diagnosis not present

## 2023-06-07 DIAGNOSIS — J301 Allergic rhinitis due to pollen: Secondary | ICD-10-CM | POA: Diagnosis not present

## 2023-06-07 DIAGNOSIS — J3089 Other allergic rhinitis: Secondary | ICD-10-CM | POA: Diagnosis not present

## 2023-06-07 DIAGNOSIS — J3081 Allergic rhinitis due to animal (cat) (dog) hair and dander: Secondary | ICD-10-CM | POA: Diagnosis not present

## 2023-06-14 DIAGNOSIS — J3089 Other allergic rhinitis: Secondary | ICD-10-CM | POA: Diagnosis not present

## 2023-06-14 DIAGNOSIS — J301 Allergic rhinitis due to pollen: Secondary | ICD-10-CM | POA: Diagnosis not present

## 2023-06-14 DIAGNOSIS — J3081 Allergic rhinitis due to animal (cat) (dog) hair and dander: Secondary | ICD-10-CM | POA: Diagnosis not present

## 2023-06-18 DIAGNOSIS — G4733 Obstructive sleep apnea (adult) (pediatric): Secondary | ICD-10-CM | POA: Diagnosis not present

## 2023-06-21 DIAGNOSIS — J301 Allergic rhinitis due to pollen: Secondary | ICD-10-CM | POA: Diagnosis not present

## 2023-06-21 DIAGNOSIS — J3089 Other allergic rhinitis: Secondary | ICD-10-CM | POA: Diagnosis not present

## 2023-06-21 DIAGNOSIS — J3081 Allergic rhinitis due to animal (cat) (dog) hair and dander: Secondary | ICD-10-CM | POA: Diagnosis not present

## 2023-06-22 DIAGNOSIS — E559 Vitamin D deficiency, unspecified: Secondary | ICD-10-CM | POA: Diagnosis not present

## 2023-06-22 DIAGNOSIS — R739 Hyperglycemia, unspecified: Secondary | ICD-10-CM | POA: Diagnosis not present

## 2023-06-22 DIAGNOSIS — R5383 Other fatigue: Secondary | ICD-10-CM | POA: Diagnosis not present

## 2023-06-22 DIAGNOSIS — E785 Hyperlipidemia, unspecified: Secondary | ICD-10-CM | POA: Diagnosis not present

## 2023-06-28 DIAGNOSIS — J3081 Allergic rhinitis due to animal (cat) (dog) hair and dander: Secondary | ICD-10-CM | POA: Diagnosis not present

## 2023-06-28 DIAGNOSIS — J301 Allergic rhinitis due to pollen: Secondary | ICD-10-CM | POA: Diagnosis not present

## 2023-06-28 DIAGNOSIS — J3089 Other allergic rhinitis: Secondary | ICD-10-CM | POA: Diagnosis not present

## 2023-06-29 DIAGNOSIS — J3089 Other allergic rhinitis: Secondary | ICD-10-CM | POA: Diagnosis not present

## 2023-06-29 DIAGNOSIS — E559 Vitamin D deficiency, unspecified: Secondary | ICD-10-CM | POA: Diagnosis not present

## 2023-06-29 DIAGNOSIS — E1165 Type 2 diabetes mellitus with hyperglycemia: Secondary | ICD-10-CM | POA: Diagnosis not present

## 2023-06-29 DIAGNOSIS — Z789 Other specified health status: Secondary | ICD-10-CM | POA: Diagnosis not present

## 2023-06-29 DIAGNOSIS — J3081 Allergic rhinitis due to animal (cat) (dog) hair and dander: Secondary | ICD-10-CM | POA: Diagnosis not present

## 2023-06-29 DIAGNOSIS — E782 Mixed hyperlipidemia: Secondary | ICD-10-CM | POA: Diagnosis not present

## 2023-06-29 DIAGNOSIS — J301 Allergic rhinitis due to pollen: Secondary | ICD-10-CM | POA: Diagnosis not present

## 2023-07-05 DIAGNOSIS — J3081 Allergic rhinitis due to animal (cat) (dog) hair and dander: Secondary | ICD-10-CM | POA: Diagnosis not present

## 2023-07-05 DIAGNOSIS — J3089 Other allergic rhinitis: Secondary | ICD-10-CM | POA: Diagnosis not present

## 2023-07-05 DIAGNOSIS — J301 Allergic rhinitis due to pollen: Secondary | ICD-10-CM | POA: Diagnosis not present

## 2023-07-12 DIAGNOSIS — J301 Allergic rhinitis due to pollen: Secondary | ICD-10-CM | POA: Diagnosis not present

## 2023-07-12 DIAGNOSIS — J3081 Allergic rhinitis due to animal (cat) (dog) hair and dander: Secondary | ICD-10-CM | POA: Diagnosis not present

## 2023-07-12 DIAGNOSIS — J3089 Other allergic rhinitis: Secondary | ICD-10-CM | POA: Diagnosis not present

## 2023-07-19 DIAGNOSIS — J3089 Other allergic rhinitis: Secondary | ICD-10-CM | POA: Diagnosis not present

## 2023-07-19 DIAGNOSIS — J301 Allergic rhinitis due to pollen: Secondary | ICD-10-CM | POA: Diagnosis not present

## 2023-07-19 DIAGNOSIS — J3081 Allergic rhinitis due to animal (cat) (dog) hair and dander: Secondary | ICD-10-CM | POA: Diagnosis not present

## 2023-07-26 DIAGNOSIS — J301 Allergic rhinitis due to pollen: Secondary | ICD-10-CM | POA: Diagnosis not present

## 2023-07-26 DIAGNOSIS — J3089 Other allergic rhinitis: Secondary | ICD-10-CM | POA: Diagnosis not present

## 2023-07-26 DIAGNOSIS — J3081 Allergic rhinitis due to animal (cat) (dog) hair and dander: Secondary | ICD-10-CM | POA: Diagnosis not present

## 2023-08-02 DIAGNOSIS — J3089 Other allergic rhinitis: Secondary | ICD-10-CM | POA: Diagnosis not present

## 2023-08-02 DIAGNOSIS — J301 Allergic rhinitis due to pollen: Secondary | ICD-10-CM | POA: Diagnosis not present

## 2023-08-02 DIAGNOSIS — J3081 Allergic rhinitis due to animal (cat) (dog) hair and dander: Secondary | ICD-10-CM | POA: Diagnosis not present

## 2023-08-09 DIAGNOSIS — J301 Allergic rhinitis due to pollen: Secondary | ICD-10-CM | POA: Diagnosis not present

## 2023-08-09 DIAGNOSIS — J3089 Other allergic rhinitis: Secondary | ICD-10-CM | POA: Diagnosis not present

## 2023-08-09 DIAGNOSIS — J3081 Allergic rhinitis due to animal (cat) (dog) hair and dander: Secondary | ICD-10-CM | POA: Diagnosis not present

## 2023-08-16 DIAGNOSIS — J301 Allergic rhinitis due to pollen: Secondary | ICD-10-CM | POA: Diagnosis not present

## 2023-08-16 DIAGNOSIS — J3089 Other allergic rhinitis: Secondary | ICD-10-CM | POA: Diagnosis not present

## 2023-08-16 DIAGNOSIS — J3081 Allergic rhinitis due to animal (cat) (dog) hair and dander: Secondary | ICD-10-CM | POA: Diagnosis not present

## 2023-08-25 DIAGNOSIS — E785 Hyperlipidemia, unspecified: Secondary | ICD-10-CM | POA: Diagnosis not present

## 2023-08-26 DIAGNOSIS — J3089 Other allergic rhinitis: Secondary | ICD-10-CM | POA: Diagnosis not present

## 2023-08-26 DIAGNOSIS — J301 Allergic rhinitis due to pollen: Secondary | ICD-10-CM | POA: Diagnosis not present

## 2023-08-26 DIAGNOSIS — J4530 Mild persistent asthma, uncomplicated: Secondary | ICD-10-CM | POA: Diagnosis not present

## 2023-08-26 DIAGNOSIS — H1045 Other chronic allergic conjunctivitis: Secondary | ICD-10-CM | POA: Diagnosis not present

## 2023-08-29 DIAGNOSIS — I10 Essential (primary) hypertension: Secondary | ICD-10-CM | POA: Diagnosis not present

## 2023-08-29 DIAGNOSIS — E1165 Type 2 diabetes mellitus with hyperglycemia: Secondary | ICD-10-CM | POA: Diagnosis not present

## 2023-08-29 DIAGNOSIS — E782 Mixed hyperlipidemia: Secondary | ICD-10-CM | POA: Diagnosis not present

## 2023-08-29 DIAGNOSIS — Z789 Other specified health status: Secondary | ICD-10-CM | POA: Diagnosis not present

## 2023-09-05 ENCOUNTER — Telehealth: Payer: Self-pay

## 2023-09-05 NOTE — Progress Notes (Unsigned)
 Edwin Nelson

## 2023-09-05 NOTE — Telephone Encounter (Signed)
Please See MyChart message.

## 2023-09-06 ENCOUNTER — Telehealth: Payer: BC Managed Care – PPO | Admitting: Adult Health

## 2023-09-06 DIAGNOSIS — J3081 Allergic rhinitis due to animal (cat) (dog) hair and dander: Secondary | ICD-10-CM | POA: Diagnosis not present

## 2023-09-06 DIAGNOSIS — G4733 Obstructive sleep apnea (adult) (pediatric): Secondary | ICD-10-CM | POA: Diagnosis not present

## 2023-09-06 DIAGNOSIS — J301 Allergic rhinitis due to pollen: Secondary | ICD-10-CM | POA: Diagnosis not present

## 2023-09-06 DIAGNOSIS — J3089 Other allergic rhinitis: Secondary | ICD-10-CM | POA: Diagnosis not present

## 2023-09-06 NOTE — Progress Notes (Signed)
 PATIENT: Edwin Nelson DOB: Oct 18, 1970  REASON FOR VISIT: follow up HISTORY FROM: patient   Virtual Visit via Video Note  I connected with Chief A Bunda on 09/06/23 at  1:45 PM EDT by a video enabled telemedicine application located remotely at [facility] and verified that I am speaking with the correct person using two identifiers who was located in [place]   I discussed the limitations of evaluation and management by telemedicine and the availability of in person appointments. The patient expressed understanding and agreed to proceed.   PATIENT: Edwin Nelson DOB: 05/16/1971  REASON FOR VISIT: follow up HISTORY FROM: patient  HISTORY OF PRESENT ILLNESS: Today 09/06/23:  Edwin Nelson is a 53 y.o. male with a history of obstructive sleep apnea on CPAP. Returns today for follow-up.  He reports that CPAP is working well.  He denies any new issues.  Continues to find it beneficial.  Reports that he is tolerating the settings well.  Changes out his supplies regularly.  His download is below     09/07/22: Edwin Nelson is a 53 y.o. male with a history of OSA on CPAP. Returns today for follow-up.  He reports that the CPAP continues to work well for him.  Denies any new symptoms.  Continues to find it beneficial.  Download is below       08/25/21:Edwin Nelson is a 53 year old male with a history of obstructive sleep apnea on CPAP.  He returns today for follow-up.  He reports that the CPAP is working well for him.  He denies any new issues. He does report that he has a bulging disk in the neck- currently currently in physical therapy.  He states sometimes this causes some numbness down the arm.    REVIEW OF SYSTEMS: Out of a complete 14 system review of symptoms, the patient complains only of the following symptoms, and all other reviewed systems are negative.  ALLERGIES: No Known Allergies  HOME MEDICATIONS: Outpatient Medications Prior to Visit   Medication Sig Dispense Refill   amLODipine (NORVASC) 2.5 MG tablet Take 2.5 mg by mouth daily.     ASPIRIN PO Take 81 mg by mouth daily.     Blood Glucose Monitoring Suppl (ONE TOUCH ULTRA SYSTEM KIT) W/DEVICE KIT 1 kit by Does not apply route once. 1 each 0   Cholecalciferol (VITAMIN D3 PO) Take 5,000 Units by mouth daily.     EPINEPHrine 0.3 mg/0.3 mL IJ SOAJ injection as needed.     fenofibrate 160 MG tablet Take 160 mg by mouth daily.     fluticasone (FLONASE) 50 MCG/ACT nasal spray USE 1 SPRAY NASALLY TWICE A DAY 16 g 10   furosemide (LASIX) 20 MG tablet Take 1 tablet (20 mg total) by mouth daily. 30 tablet 0   glucose blood test strip Use up to twice daily, dx 250.00 100 each 3   Lancets (ONETOUCH ULTRASOFT) lancets Use up to twice daily, dx 250.00 100 each 3   levocetirizine (XYZAL) 5 MG tablet Take 5 mg by mouth daily.     metFORMIN (GLUCOPHAGE) 500 MG tablet Take by mouth once. Take 1000 mg morning and 500 mg in the evening     metoprolol succinate (TOPROL-XL) 50 MG 24 hr tablet Take 50 mg by mouth daily.      montelukast (SINGULAIR) 10 MG tablet Take 10 mg by mouth daily.     PROVENTIL HFA 108 (90 BASE) MCG/ACT inhaler INHALE 2 PUFFS  THREE TIMES A DAY AS NEEDED 18 g 1   QVAR 80 MCG/ACT inhaler INHALE 1 PUFF INTO THE LUNGS TWICE A DAY 3 g 3   rosuvastatin (CRESTOR) 20 MG tablet Take 20 mg by mouth daily.     Spacer/Aero-Holding Chambers (AEROCHAMBER PLUS FLO-VU W/MASK) MISC      valsartan (DIOVAN) 80 MG tablet Take 80 mg by mouth daily.     VASCEPA 1 g CAPS Take 1 g by mouth in the morning, at noon, in the evening, and at bedtime.      No facility-administered medications prior to visit.    PAST MEDICAL HISTORY: Past Medical History:  Diagnosis Date   Allergy    seasonal allergies   Asthma    minor issue   Bulging of thoracic intervertebral disc 2019   C4-6   Diabetes mellitus without complication (HCC)    on meds   Hyperlipidemia    on meds   Hypertension    on  meds   Sleep apnea    uses CPAP    PAST SURGICAL HISTORY: Past Surgical History:  Procedure Laterality Date   COLONOSCOPY  02/12/2020   GANGLION CYST EXCISION Right 2004   TONSILLECTOMY AND ADENOIDECTOMY  1985   WISDOM TOOTH EXTRACTION  1991    FAMILY HISTORY: Family History  Problem Relation Age of Onset   Diabetes Father    Heart disease Father    Stroke Father    Colon cancer Neg Hx    Colon polyps Neg Hx    Esophageal cancer Neg Hx    Rectal cancer Neg Hx    Stomach cancer Neg Hx     SOCIAL HISTORY: Social History   Socioeconomic History   Marital status: Single    Spouse name: Not on file   Number of children: Not on file   Years of education: Not on file   Highest education level: Not on file  Occupational History   Not on file  Tobacco Use   Smoking status: Former    Current packs/day: 0.00    Types: Cigarettes    Start date: 42    Quit date: 2008    Years since quitting: 17.2   Smokeless tobacco: Never  Vaping Use   Vaping status: Never Used  Substance and Sexual Activity   Alcohol use: Yes    Alcohol/week: 12.0 standard drinks of alcohol    Types: 12 Cans of beer per week   Drug use: No   Sexual activity: Not on file  Other Topics Concern   Not on file  Social History Narrative   Not on file   Social Drivers of Health   Financial Resource Strain: Not on file  Food Insecurity: Not on file  Transportation Needs: Not on file  Physical Activity: Not on file  Stress: Not on file  Social Connections: Not on file  Intimate Partner Violence: Not on file      PHYSICAL EXAM Generalized: Well developed, in no acute distress   Neurological examination  Mentation: Alert oriented to time, place, history taking. Follows all commands speech and language fluent Cranial nerve II-XII: Facial symmetry noted  DIAGNOSTIC DATA (LABS, IMAGING, TESTING) - I reviewed patient records, labs, notes, testing and imaging myself where available.  Lab  Results  Component Value Date   WBC 4.0 01/02/2021   HGB 12.3 (L) 01/02/2021   HCT 37.2 (L) 01/02/2021   MCV 94.9 01/02/2021   PLT 184 01/02/2021      Component Value  Date/Time   NA 139 01/12/2021 1323   K 4.2 01/12/2021 1323   CL 105 01/12/2021 1323   CO2 22 01/12/2021 1323   GLUCOSE 111 (H) 01/12/2021 1323   BUN 17 01/12/2021 1323   CREATININE 1.17 01/12/2021 1323   CALCIUM 10.1 01/12/2021 1323   CALCIUM 10.8 (H) 01/09/2021 0819   PROT 8.0 01/12/2021 1323   ALBUMIN 4.4 01/12/2021 1323   AST 46 (H) 01/12/2021 1323   ALT 66 (H) 01/12/2021 1323   ALKPHOS 27 (L) 01/12/2021 1323   BILITOT 0.4 01/12/2021 1323   GFRNONAA >60 01/12/2021 1323   GFRAA 109 04/22/2008 1010   Lab Results  Component Value Date   CHOL 185 08/23/2012   HDL 47.80 08/23/2012   LDLCALC 41 04/22/2008   LDLDIRECT 60.1 08/23/2012   TRIG (H) 08/23/2012    401.0 Triglyceride is over 400; calculations on Lipids are invalid.   CHOLHDL 4 08/23/2012   Lab Results  Component Value Date   HGBA1C 6.3 (A) 09/07/2012   Lab Results  Component Value Date   VITAMINB12 240 01/02/2021   Lab Results  Component Value Date   TSH 2.47 08/23/2012      ASSESSMENT AND PLAN 53 y.o. year old male  has a past medical history of Allergy, Asthma, Bulging of thoracic intervertebral disc (2019), Diabetes mellitus without complication (HCC), Hyperlipidemia, Hypertension, and Sleep apnea. here with:  OSA on CPAP  CPAP compliance excellent Residual AHI is good Encouraged patient to continue using CPAP nightly and > 4 hours each night F/U in 1 year or sooner if needed    Butch Penny, MSN, NP-C 09/06/2023, 1:46 PM [facility] [address] [place], [place] [address] [phone]

## 2023-09-08 DIAGNOSIS — E119 Type 2 diabetes mellitus without complications: Secondary | ICD-10-CM | POA: Diagnosis not present

## 2023-09-08 DIAGNOSIS — H52203 Unspecified astigmatism, bilateral: Secondary | ICD-10-CM | POA: Diagnosis not present

## 2023-09-08 DIAGNOSIS — H04223 Epiphora due to insufficient drainage, bilateral lacrimal glands: Secondary | ICD-10-CM | POA: Diagnosis not present

## 2023-09-08 DIAGNOSIS — H5213 Myopia, bilateral: Secondary | ICD-10-CM | POA: Diagnosis not present

## 2023-09-13 DIAGNOSIS — J3081 Allergic rhinitis due to animal (cat) (dog) hair and dander: Secondary | ICD-10-CM | POA: Diagnosis not present

## 2023-09-13 DIAGNOSIS — J301 Allergic rhinitis due to pollen: Secondary | ICD-10-CM | POA: Diagnosis not present

## 2023-09-13 DIAGNOSIS — J3089 Other allergic rhinitis: Secondary | ICD-10-CM | POA: Diagnosis not present

## 2023-09-20 DIAGNOSIS — J3089 Other allergic rhinitis: Secondary | ICD-10-CM | POA: Diagnosis not present

## 2023-09-20 DIAGNOSIS — J3081 Allergic rhinitis due to animal (cat) (dog) hair and dander: Secondary | ICD-10-CM | POA: Diagnosis not present

## 2023-09-20 DIAGNOSIS — J301 Allergic rhinitis due to pollen: Secondary | ICD-10-CM | POA: Diagnosis not present

## 2023-09-26 DIAGNOSIS — H04213 Epiphora due to excess lacrimation, bilateral lacrimal glands: Secondary | ICD-10-CM | POA: Diagnosis not present

## 2023-09-26 DIAGNOSIS — H04222 Epiphora due to insufficient drainage, left lacrimal gland: Secondary | ICD-10-CM | POA: Diagnosis not present

## 2023-09-26 DIAGNOSIS — H04221 Epiphora due to insufficient drainage, right lacrimal gland: Secondary | ICD-10-CM | POA: Diagnosis not present

## 2023-09-26 DIAGNOSIS — H04123 Dry eye syndrome of bilateral lacrimal glands: Secondary | ICD-10-CM | POA: Diagnosis not present

## 2023-09-27 DIAGNOSIS — J3089 Other allergic rhinitis: Secondary | ICD-10-CM | POA: Diagnosis not present

## 2023-09-27 DIAGNOSIS — J3081 Allergic rhinitis due to animal (cat) (dog) hair and dander: Secondary | ICD-10-CM | POA: Diagnosis not present

## 2023-09-27 DIAGNOSIS — J301 Allergic rhinitis due to pollen: Secondary | ICD-10-CM | POA: Diagnosis not present

## 2023-10-04 DIAGNOSIS — J301 Allergic rhinitis due to pollen: Secondary | ICD-10-CM | POA: Diagnosis not present

## 2023-10-04 DIAGNOSIS — J3081 Allergic rhinitis due to animal (cat) (dog) hair and dander: Secondary | ICD-10-CM | POA: Diagnosis not present

## 2023-10-04 DIAGNOSIS — J3089 Other allergic rhinitis: Secondary | ICD-10-CM | POA: Diagnosis not present

## 2023-10-11 DIAGNOSIS — J3089 Other allergic rhinitis: Secondary | ICD-10-CM | POA: Diagnosis not present

## 2023-10-11 DIAGNOSIS — J301 Allergic rhinitis due to pollen: Secondary | ICD-10-CM | POA: Diagnosis not present

## 2023-10-11 DIAGNOSIS — J3081 Allergic rhinitis due to animal (cat) (dog) hair and dander: Secondary | ICD-10-CM | POA: Diagnosis not present

## 2023-10-19 DIAGNOSIS — J3081 Allergic rhinitis due to animal (cat) (dog) hair and dander: Secondary | ICD-10-CM | POA: Diagnosis not present

## 2023-10-19 DIAGNOSIS — J301 Allergic rhinitis due to pollen: Secondary | ICD-10-CM | POA: Diagnosis not present

## 2023-10-19 DIAGNOSIS — G4733 Obstructive sleep apnea (adult) (pediatric): Secondary | ICD-10-CM | POA: Diagnosis not present

## 2023-10-19 DIAGNOSIS — J3089 Other allergic rhinitis: Secondary | ICD-10-CM | POA: Diagnosis not present

## 2023-10-26 DIAGNOSIS — J301 Allergic rhinitis due to pollen: Secondary | ICD-10-CM | POA: Diagnosis not present

## 2023-10-26 DIAGNOSIS — J3081 Allergic rhinitis due to animal (cat) (dog) hair and dander: Secondary | ICD-10-CM | POA: Diagnosis not present

## 2023-10-26 DIAGNOSIS — J3089 Other allergic rhinitis: Secondary | ICD-10-CM | POA: Diagnosis not present

## 2023-10-28 DIAGNOSIS — R739 Hyperglycemia, unspecified: Secondary | ICD-10-CM | POA: Diagnosis not present

## 2023-10-28 DIAGNOSIS — I10 Essential (primary) hypertension: Secondary | ICD-10-CM | POA: Diagnosis not present

## 2023-11-01 DIAGNOSIS — J3089 Other allergic rhinitis: Secondary | ICD-10-CM | POA: Diagnosis not present

## 2023-11-01 DIAGNOSIS — J3081 Allergic rhinitis due to animal (cat) (dog) hair and dander: Secondary | ICD-10-CM | POA: Diagnosis not present

## 2023-11-01 DIAGNOSIS — J301 Allergic rhinitis due to pollen: Secondary | ICD-10-CM | POA: Diagnosis not present

## 2023-11-08 DIAGNOSIS — J3081 Allergic rhinitis due to animal (cat) (dog) hair and dander: Secondary | ICD-10-CM | POA: Diagnosis not present

## 2023-11-08 DIAGNOSIS — J301 Allergic rhinitis due to pollen: Secondary | ICD-10-CM | POA: Diagnosis not present

## 2023-11-08 DIAGNOSIS — J3089 Other allergic rhinitis: Secondary | ICD-10-CM | POA: Diagnosis not present

## 2023-11-09 DIAGNOSIS — E1165 Type 2 diabetes mellitus with hyperglycemia: Secondary | ICD-10-CM | POA: Diagnosis not present

## 2023-11-09 DIAGNOSIS — J309 Allergic rhinitis, unspecified: Secondary | ICD-10-CM | POA: Diagnosis not present

## 2023-11-09 DIAGNOSIS — I10 Essential (primary) hypertension: Secondary | ICD-10-CM | POA: Diagnosis not present

## 2023-11-15 DIAGNOSIS — J3089 Other allergic rhinitis: Secondary | ICD-10-CM | POA: Diagnosis not present

## 2023-11-15 DIAGNOSIS — J301 Allergic rhinitis due to pollen: Secondary | ICD-10-CM | POA: Diagnosis not present

## 2023-11-15 DIAGNOSIS — J3081 Allergic rhinitis due to animal (cat) (dog) hair and dander: Secondary | ICD-10-CM | POA: Diagnosis not present

## 2023-11-19 DIAGNOSIS — G4733 Obstructive sleep apnea (adult) (pediatric): Secondary | ICD-10-CM | POA: Diagnosis not present

## 2023-11-22 DIAGNOSIS — J301 Allergic rhinitis due to pollen: Secondary | ICD-10-CM | POA: Diagnosis not present

## 2023-11-22 DIAGNOSIS — J3081 Allergic rhinitis due to animal (cat) (dog) hair and dander: Secondary | ICD-10-CM | POA: Diagnosis not present

## 2023-11-22 DIAGNOSIS — J3089 Other allergic rhinitis: Secondary | ICD-10-CM | POA: Diagnosis not present

## 2023-11-28 DIAGNOSIS — J3089 Other allergic rhinitis: Secondary | ICD-10-CM | POA: Diagnosis not present

## 2023-11-28 DIAGNOSIS — J3081 Allergic rhinitis due to animal (cat) (dog) hair and dander: Secondary | ICD-10-CM | POA: Diagnosis not present

## 2023-11-28 DIAGNOSIS — J301 Allergic rhinitis due to pollen: Secondary | ICD-10-CM | POA: Diagnosis not present

## 2023-11-29 DIAGNOSIS — J3089 Other allergic rhinitis: Secondary | ICD-10-CM | POA: Diagnosis not present

## 2023-11-29 DIAGNOSIS — J301 Allergic rhinitis due to pollen: Secondary | ICD-10-CM | POA: Diagnosis not present

## 2023-11-29 DIAGNOSIS — J3081 Allergic rhinitis due to animal (cat) (dog) hair and dander: Secondary | ICD-10-CM | POA: Diagnosis not present

## 2023-12-06 DIAGNOSIS — J3081 Allergic rhinitis due to animal (cat) (dog) hair and dander: Secondary | ICD-10-CM | POA: Diagnosis not present

## 2023-12-06 DIAGNOSIS — J301 Allergic rhinitis due to pollen: Secondary | ICD-10-CM | POA: Diagnosis not present

## 2023-12-06 DIAGNOSIS — J3089 Other allergic rhinitis: Secondary | ICD-10-CM | POA: Diagnosis not present

## 2023-12-13 DIAGNOSIS — J3089 Other allergic rhinitis: Secondary | ICD-10-CM | POA: Diagnosis not present

## 2023-12-13 DIAGNOSIS — J3081 Allergic rhinitis due to animal (cat) (dog) hair and dander: Secondary | ICD-10-CM | POA: Diagnosis not present

## 2023-12-13 DIAGNOSIS — J301 Allergic rhinitis due to pollen: Secondary | ICD-10-CM | POA: Diagnosis not present

## 2023-12-20 DIAGNOSIS — J301 Allergic rhinitis due to pollen: Secondary | ICD-10-CM | POA: Diagnosis not present

## 2023-12-20 DIAGNOSIS — J3081 Allergic rhinitis due to animal (cat) (dog) hair and dander: Secondary | ICD-10-CM | POA: Diagnosis not present

## 2023-12-20 DIAGNOSIS — J3089 Other allergic rhinitis: Secondary | ICD-10-CM | POA: Diagnosis not present

## 2023-12-22 DIAGNOSIS — Z13220 Encounter for screening for lipoid disorders: Secondary | ICD-10-CM | POA: Diagnosis not present

## 2023-12-22 DIAGNOSIS — Z125 Encounter for screening for malignant neoplasm of prostate: Secondary | ICD-10-CM | POA: Diagnosis not present

## 2023-12-22 DIAGNOSIS — Z0000 Encounter for general adult medical examination without abnormal findings: Secondary | ICD-10-CM | POA: Diagnosis not present

## 2023-12-28 DIAGNOSIS — J301 Allergic rhinitis due to pollen: Secondary | ICD-10-CM | POA: Diagnosis not present

## 2023-12-28 DIAGNOSIS — J3081 Allergic rhinitis due to animal (cat) (dog) hair and dander: Secondary | ICD-10-CM | POA: Diagnosis not present

## 2023-12-28 DIAGNOSIS — H6123 Impacted cerumen, bilateral: Secondary | ICD-10-CM | POA: Diagnosis not present

## 2023-12-28 DIAGNOSIS — Z0000 Encounter for general adult medical examination without abnormal findings: Secondary | ICD-10-CM | POA: Diagnosis not present

## 2023-12-28 DIAGNOSIS — J3089 Other allergic rhinitis: Secondary | ICD-10-CM | POA: Diagnosis not present

## 2023-12-30 DIAGNOSIS — H6091 Unspecified otitis externa, right ear: Secondary | ICD-10-CM | POA: Diagnosis not present

## 2023-12-30 DIAGNOSIS — I10 Essential (primary) hypertension: Secondary | ICD-10-CM | POA: Diagnosis not present

## 2023-12-30 DIAGNOSIS — H7291 Unspecified perforation of tympanic membrane, right ear: Secondary | ICD-10-CM | POA: Diagnosis not present

## 2024-01-02 DIAGNOSIS — I10 Essential (primary) hypertension: Secondary | ICD-10-CM | POA: Diagnosis not present

## 2024-01-02 DIAGNOSIS — E1165 Type 2 diabetes mellitus with hyperglycemia: Secondary | ICD-10-CM | POA: Diagnosis not present

## 2024-01-02 DIAGNOSIS — H6121 Impacted cerumen, right ear: Secondary | ICD-10-CM | POA: Diagnosis not present

## 2024-01-02 DIAGNOSIS — H6091 Unspecified otitis externa, right ear: Secondary | ICD-10-CM | POA: Diagnosis not present

## 2024-01-03 DIAGNOSIS — H9221 Otorrhagia, right ear: Secondary | ICD-10-CM | POA: Diagnosis not present

## 2024-01-03 DIAGNOSIS — J3089 Other allergic rhinitis: Secondary | ICD-10-CM | POA: Diagnosis not present

## 2024-01-03 DIAGNOSIS — J301 Allergic rhinitis due to pollen: Secondary | ICD-10-CM | POA: Diagnosis not present

## 2024-01-03 DIAGNOSIS — J3081 Allergic rhinitis due to animal (cat) (dog) hair and dander: Secondary | ICD-10-CM | POA: Diagnosis not present

## 2024-01-04 DIAGNOSIS — G4733 Obstructive sleep apnea (adult) (pediatric): Secondary | ICD-10-CM | POA: Diagnosis not present

## 2024-01-05 DIAGNOSIS — T148XXA Other injury of unspecified body region, initial encounter: Secondary | ICD-10-CM | POA: Diagnosis not present

## 2024-01-10 DIAGNOSIS — J3089 Other allergic rhinitis: Secondary | ICD-10-CM | POA: Diagnosis not present

## 2024-01-10 DIAGNOSIS — J301 Allergic rhinitis due to pollen: Secondary | ICD-10-CM | POA: Diagnosis not present

## 2024-01-10 DIAGNOSIS — J3081 Allergic rhinitis due to animal (cat) (dog) hair and dander: Secondary | ICD-10-CM | POA: Diagnosis not present

## 2024-01-17 DIAGNOSIS — J3089 Other allergic rhinitis: Secondary | ICD-10-CM | POA: Diagnosis not present

## 2024-01-17 DIAGNOSIS — J3081 Allergic rhinitis due to animal (cat) (dog) hair and dander: Secondary | ICD-10-CM | POA: Diagnosis not present

## 2024-01-17 DIAGNOSIS — J301 Allergic rhinitis due to pollen: Secondary | ICD-10-CM | POA: Diagnosis not present

## 2024-01-24 DIAGNOSIS — J3081 Allergic rhinitis due to animal (cat) (dog) hair and dander: Secondary | ICD-10-CM | POA: Diagnosis not present

## 2024-01-24 DIAGNOSIS — J3089 Other allergic rhinitis: Secondary | ICD-10-CM | POA: Diagnosis not present

## 2024-01-24 DIAGNOSIS — J301 Allergic rhinitis due to pollen: Secondary | ICD-10-CM | POA: Diagnosis not present

## 2024-01-31 DIAGNOSIS — J3089 Other allergic rhinitis: Secondary | ICD-10-CM | POA: Diagnosis not present

## 2024-01-31 DIAGNOSIS — J301 Allergic rhinitis due to pollen: Secondary | ICD-10-CM | POA: Diagnosis not present

## 2024-02-10 DIAGNOSIS — J3089 Other allergic rhinitis: Secondary | ICD-10-CM | POA: Diagnosis not present

## 2024-02-10 DIAGNOSIS — J3081 Allergic rhinitis due to animal (cat) (dog) hair and dander: Secondary | ICD-10-CM | POA: Diagnosis not present

## 2024-02-10 DIAGNOSIS — J301 Allergic rhinitis due to pollen: Secondary | ICD-10-CM | POA: Diagnosis not present

## 2024-02-15 DIAGNOSIS — E785 Hyperlipidemia, unspecified: Secondary | ICD-10-CM | POA: Diagnosis not present

## 2024-02-15 DIAGNOSIS — R739 Hyperglycemia, unspecified: Secondary | ICD-10-CM | POA: Diagnosis not present

## 2024-02-17 DIAGNOSIS — J301 Allergic rhinitis due to pollen: Secondary | ICD-10-CM | POA: Diagnosis not present

## 2024-02-17 DIAGNOSIS — J3089 Other allergic rhinitis: Secondary | ICD-10-CM | POA: Diagnosis not present

## 2024-02-17 DIAGNOSIS — J3081 Allergic rhinitis due to animal (cat) (dog) hair and dander: Secondary | ICD-10-CM | POA: Diagnosis not present

## 2024-02-21 DIAGNOSIS — I10 Essential (primary) hypertension: Secondary | ICD-10-CM | POA: Diagnosis not present

## 2024-02-21 DIAGNOSIS — E782 Mixed hyperlipidemia: Secondary | ICD-10-CM | POA: Diagnosis not present

## 2024-02-21 DIAGNOSIS — E1165 Type 2 diabetes mellitus with hyperglycemia: Secondary | ICD-10-CM | POA: Diagnosis not present

## 2024-02-21 DIAGNOSIS — Z789 Other specified health status: Secondary | ICD-10-CM | POA: Diagnosis not present

## 2024-02-29 ENCOUNTER — Encounter: Payer: Self-pay | Admitting: *Deleted

## 2024-02-29 DIAGNOSIS — J3081 Allergic rhinitis due to animal (cat) (dog) hair and dander: Secondary | ICD-10-CM | POA: Diagnosis not present

## 2024-02-29 DIAGNOSIS — J3089 Other allergic rhinitis: Secondary | ICD-10-CM | POA: Diagnosis not present

## 2024-02-29 DIAGNOSIS — J301 Allergic rhinitis due to pollen: Secondary | ICD-10-CM | POA: Diagnosis not present

## 2024-02-29 NOTE — Progress Notes (Signed)
 NALU TROUBLEFIELD                                          MRN: [id number]   02/29/2024   The [facility] Quality Team Specialist reviewed this patient medical record for the purposes of chart review for care gap closure. The following were reviewed: chart review for care gap closure-kidney health evaluation for diabetes:eGFR  and uACR.    [facility]I Quality Team

## 2024-03-07 DIAGNOSIS — J3081 Allergic rhinitis due to animal (cat) (dog) hair and dander: Secondary | ICD-10-CM | POA: Diagnosis not present

## 2024-03-07 DIAGNOSIS — J301 Allergic rhinitis due to pollen: Secondary | ICD-10-CM | POA: Diagnosis not present

## 2024-03-07 DIAGNOSIS — J3089 Other allergic rhinitis: Secondary | ICD-10-CM | POA: Diagnosis not present

## 2024-03-21 DIAGNOSIS — J3081 Allergic rhinitis due to animal (cat) (dog) hair and dander: Secondary | ICD-10-CM | POA: Diagnosis not present

## 2024-03-21 DIAGNOSIS — J3089 Other allergic rhinitis: Secondary | ICD-10-CM | POA: Diagnosis not present

## 2024-03-21 DIAGNOSIS — J301 Allergic rhinitis due to pollen: Secondary | ICD-10-CM | POA: Diagnosis not present

## 2024-03-28 DIAGNOSIS — J301 Allergic rhinitis due to pollen: Secondary | ICD-10-CM | POA: Diagnosis not present

## 2024-03-28 DIAGNOSIS — J3081 Allergic rhinitis due to animal (cat) (dog) hair and dander: Secondary | ICD-10-CM | POA: Diagnosis not present

## 2024-03-28 DIAGNOSIS — J3089 Other allergic rhinitis: Secondary | ICD-10-CM | POA: Diagnosis not present

## 2024-04-04 DIAGNOSIS — J3089 Other allergic rhinitis: Secondary | ICD-10-CM | POA: Diagnosis not present

## 2024-04-04 DIAGNOSIS — J3081 Allergic rhinitis due to animal (cat) (dog) hair and dander: Secondary | ICD-10-CM | POA: Diagnosis not present

## 2024-04-04 DIAGNOSIS — J301 Allergic rhinitis due to pollen: Secondary | ICD-10-CM | POA: Diagnosis not present

## 2024-04-11 DIAGNOSIS — J3081 Allergic rhinitis due to animal (cat) (dog) hair and dander: Secondary | ICD-10-CM | POA: Diagnosis not present

## 2024-04-11 DIAGNOSIS — J3089 Other allergic rhinitis: Secondary | ICD-10-CM | POA: Diagnosis not present

## 2024-04-11 DIAGNOSIS — H1045 Other chronic allergic conjunctivitis: Secondary | ICD-10-CM | POA: Diagnosis not present

## 2024-04-11 DIAGNOSIS — J301 Allergic rhinitis due to pollen: Secondary | ICD-10-CM | POA: Diagnosis not present

## 2024-04-11 DIAGNOSIS — J4530 Mild persistent asthma, uncomplicated: Secondary | ICD-10-CM | POA: Diagnosis not present

## 2024-04-18 DIAGNOSIS — J301 Allergic rhinitis due to pollen: Secondary | ICD-10-CM | POA: Diagnosis not present

## 2024-04-18 DIAGNOSIS — J3081 Allergic rhinitis due to animal (cat) (dog) hair and dander: Secondary | ICD-10-CM | POA: Diagnosis not present

## 2024-04-18 DIAGNOSIS — J3089 Other allergic rhinitis: Secondary | ICD-10-CM | POA: Diagnosis not present

## 2024-04-25 DIAGNOSIS — J3081 Allergic rhinitis due to animal (cat) (dog) hair and dander: Secondary | ICD-10-CM | POA: Diagnosis not present

## 2024-04-25 DIAGNOSIS — J301 Allergic rhinitis due to pollen: Secondary | ICD-10-CM | POA: Diagnosis not present

## 2024-04-25 DIAGNOSIS — J3089 Other allergic rhinitis: Secondary | ICD-10-CM | POA: Diagnosis not present

## 2024-05-02 DIAGNOSIS — J3089 Other allergic rhinitis: Secondary | ICD-10-CM | POA: Diagnosis not present

## 2024-05-02 DIAGNOSIS — J301 Allergic rhinitis due to pollen: Secondary | ICD-10-CM | POA: Diagnosis not present

## 2024-05-05 DIAGNOSIS — G4733 Obstructive sleep apnea (adult) (pediatric): Secondary | ICD-10-CM | POA: Diagnosis not present

## 2024-05-09 DIAGNOSIS — J3081 Allergic rhinitis due to animal (cat) (dog) hair and dander: Secondary | ICD-10-CM | POA: Diagnosis not present

## 2024-05-09 DIAGNOSIS — J3089 Other allergic rhinitis: Secondary | ICD-10-CM | POA: Diagnosis not present

## 2024-05-09 DIAGNOSIS — J301 Allergic rhinitis due to pollen: Secondary | ICD-10-CM | POA: Diagnosis not present

## 2024-05-14 DIAGNOSIS — E1165 Type 2 diabetes mellitus with hyperglycemia: Secondary | ICD-10-CM | POA: Diagnosis not present

## 2024-05-14 DIAGNOSIS — I10 Essential (primary) hypertension: Secondary | ICD-10-CM | POA: Diagnosis not present

## 2024-05-14 DIAGNOSIS — D509 Iron deficiency anemia, unspecified: Secondary | ICD-10-CM | POA: Diagnosis not present

## 2024-05-14 DIAGNOSIS — E559 Vitamin D deficiency, unspecified: Secondary | ICD-10-CM | POA: Diagnosis not present

## 2024-05-16 DIAGNOSIS — J3089 Other allergic rhinitis: Secondary | ICD-10-CM | POA: Diagnosis not present

## 2024-05-16 DIAGNOSIS — J301 Allergic rhinitis due to pollen: Secondary | ICD-10-CM | POA: Diagnosis not present

## 2024-05-21 DIAGNOSIS — J301 Allergic rhinitis due to pollen: Secondary | ICD-10-CM | POA: Diagnosis not present

## 2024-05-22 DIAGNOSIS — J3089 Other allergic rhinitis: Secondary | ICD-10-CM | POA: Diagnosis not present

## 2024-05-29 DIAGNOSIS — J3081 Allergic rhinitis due to animal (cat) (dog) hair and dander: Secondary | ICD-10-CM | POA: Diagnosis not present

## 2024-05-29 DIAGNOSIS — J3089 Other allergic rhinitis: Secondary | ICD-10-CM | POA: Diagnosis not present

## 2024-05-29 DIAGNOSIS — J301 Allergic rhinitis due to pollen: Secondary | ICD-10-CM | POA: Diagnosis not present

## 2024-08-28 ENCOUNTER — Telehealth: Admitting: Adult Health
